# Patient Record
Sex: Female | Born: 2000 | Race: White | Hispanic: No | Marital: Single | Smoking: Never smoker
Health system: Southern US, Community
[De-identification: ages and names within clinical notes are randomized; demographics above are authoritative.]

## PROBLEM LIST (undated history)

## (undated) DIAGNOSIS — D649 Anemia, unspecified: Secondary | ICD-10-CM

## (undated) DIAGNOSIS — S92909A Unspecified fracture of unspecified foot, initial encounter for closed fracture: Secondary | ICD-10-CM

## (undated) HISTORY — DX: Unspecified fracture of unspecified foot, initial encounter for closed fracture: S92.909A

---
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
## 2018-01-05 DIAGNOSIS — L819 Disorder of pigmentation, unspecified: Secondary | ICD-10-CM | POA: Diagnosis not present

## 2018-01-05 DIAGNOSIS — J988 Other specified respiratory disorders: Secondary | ICD-10-CM | POA: Diagnosis not present

## 2018-01-07 DIAGNOSIS — L309 Dermatitis, unspecified: Secondary | ICD-10-CM | POA: Diagnosis not present

## 2018-01-07 DIAGNOSIS — D485 Neoplasm of uncertain behavior of skin: Secondary | ICD-10-CM | POA: Diagnosis not present

## 2018-01-07 DIAGNOSIS — L989 Disorder of the skin and subcutaneous tissue, unspecified: Secondary | ICD-10-CM | POA: Diagnosis not present

## 2018-01-23 DIAGNOSIS — L309 Dermatitis, unspecified: Secondary | ICD-10-CM | POA: Diagnosis not present

## 2018-03-31 ENCOUNTER — Ambulatory Visit (INDEPENDENT_AMBULATORY_CARE_PROVIDER_SITE_OTHER): Payer: 59 | Admitting: Women's Health

## 2018-03-31 ENCOUNTER — Encounter: Payer: Self-pay | Admitting: Women's Health

## 2018-03-31 VITALS — BP 126/78 | Ht 67.0 in | Wt 126.0 lb

## 2018-03-31 DIAGNOSIS — Z01419 Encounter for gynecological examination (general) (routine) without abnormal findings: Secondary | ICD-10-CM | POA: Diagnosis not present

## 2018-03-31 DIAGNOSIS — R634 Abnormal weight loss: Secondary | ICD-10-CM

## 2018-03-31 LAB — CBC WITH DIFFERENTIAL/PLATELET
Absolute Monocytes: 382 cells/uL (ref 200–950)
BASOS ABS: 50 {cells}/uL (ref 0–200)
BASOS PCT: 0.6 %
EOS PCT: 2.9 %
Eosinophils Absolute: 241 cells/uL (ref 15–500)
HEMATOCRIT: 40.7 % (ref 35.0–45.0)
HEMOGLOBIN: 13.7 g/dL (ref 11.7–15.5)
Lymphs Abs: 2150 cells/uL (ref 850–3900)
MCH: 26.3 pg — ABNORMAL LOW (ref 27.0–33.0)
MCHC: 33.7 g/dL (ref 32.0–36.0)
MCV: 78.3 fL — ABNORMAL LOW (ref 80.0–100.0)
MPV: 10 fL (ref 7.5–12.5)
Monocytes Relative: 4.6 %
NEUTROS ABS: 5478 {cells}/uL (ref 1500–7800)
Neutrophils Relative %: 66 %
Platelets: 226 10*3/uL (ref 140–400)
RBC: 5.2 10*6/uL — ABNORMAL HIGH (ref 3.80–5.10)
RDW: 12.9 % (ref 11.0–15.0)
Total Lymphocyte: 25.9 %
WBC: 8.3 10*3/uL (ref 3.8–10.8)

## 2018-03-31 LAB — TSH: TSH: 1.49 mIU/L

## 2018-03-31 MED ORDER — ETONOGESTREL-ETHINYL ESTRADIOL 0.12-0.015 MG/24HR VA RING: 1.0000 | VAGINAL_RING | VAGINAL | 4 refills | Status: DC

## 2018-03-31 NOTE — Progress Notes (Signed)
Amanda Ingram [birth year] [id number]    History: 17 y.o. SWF, presents for annual exam. Regular monthly cycles with Nuvaring. Guardasil vaccine series complete.Reports bruises on arms bilaterally and back of right leg without injury. Noticed bruises in April, has seen dermatologist.  Taking iron supplement daily, history of anemia.  Not sexually active at this time, denies need for STD screen, negative screen with past partner.  Always thin, good appetite.  Past medical history, past surgical history, family history and social history were all reviewed and documented in the EPIC chart. 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 Lives at home. 1 younger brother who is 34 years old. Parents healthy.  ROS:  A ROS was performed and pertinent positives and negatives are included.  Exam:  Vitals:   03/31/18 1008  BP: 126/78  Weight: 126 lb (57.2 kg)  Height: 5\' 7"  (1.702 m)   Body mass index is 19.73 kg/m.   General appearance:  Normal Thyroid:  Symmetrical, normal in size, without palpable masses or nodularity. Respiratory  Auscultation:  Clear without wheezing or rhonchi Cardiovascular  Auscultation:  Regular rate, without rubs, murmurs or gallops  Edema/varicosities:  Not grossly evident Abdominal  Soft,nontender, without masses, guarding or rebound.  Liver/spleen:  No organomegaly noted  Hernia:  None appreciated  Skin  Inspection:  Grossly normal   Breasts: Examined lying and sitting.     Right: Without masses, retractions, discharge or axillary adenopathy.     Left: Without masses, retractions, discharge or axillary adenopathy. Gentitourinary   Inguinal/mons:  Normal without inguinal adenopathy  External genitalia:  Normal  BUS/Urethra/Skene's glands:  Normal  Vagina:  Normal  Cervix:  Normal  Uterus:  normal in size, shape and  contour.  Midline and mobile  Adnexa/parametria:     Rt: Without masses or tenderness.   Lt: Without masses or tenderness.  Anus and perineum: Normal   Assessment/Plan:  17 y.o.  for annual exam with bruises on arms and back of right leg since April.  Monthly cycle on NuvaRing History of Anemia  Plan: Encouraged daily multivitamin with iron supplement. Encouraged high calcium , iron rich diet, , exercise and  SBE's.  Nuvaring prescription, discussed proper use, slight risk for blood clots and strokes. Discussed college safety activities and practices. Condoms encouraged when sexually active. CBC, TSH.    [place], 10:39 AM 03/31/2018

## 2018-04-01 DIAGNOSIS — L920 Granuloma annulare: Secondary | ICD-10-CM | POA: Diagnosis not present

## 2018-04-14 ENCOUNTER — Telehealth: Payer: Self-pay | Admitting: *Deleted

## 2018-04-14 DIAGNOSIS — Z862 Personal history of diseases of the blood and blood-forming organs and certain disorders involving the immune mechanism: Secondary | ICD-10-CM

## 2018-04-14 DIAGNOSIS — R718 Other abnormality of red blood cells: Secondary | ICD-10-CM

## 2018-04-14 NOTE — Telephone Encounter (Signed)
Patient mother Amy called stating patient is requesting referral to hematology c/o skin bruising, that was mentioned at [facility] on 03/31/18, has been there for several months. Please advise

## 2018-04-14 NOTE — Telephone Encounter (Signed)
Okay, Dr. Marin Olp would be good.  She had a normal H&H, MCV slightly decreased and RBC slightly increased.  Thanks for getting that scheduled.

## 2018-04-14 NOTE — Telephone Encounter (Signed)
Left message on mother cell for patient to check my chart for lab results and referral placed at hematology they will call to schedule.

## 2018-04-17 DIAGNOSIS — Z20828 Contact with and (suspected) exposure to other viral communicable diseases: Secondary | ICD-10-CM | POA: Diagnosis not present

## 2018-04-17 DIAGNOSIS — J029 Acute pharyngitis, unspecified: Secondary | ICD-10-CM | POA: Diagnosis not present

## 2018-04-21 ENCOUNTER — Encounter: Payer: Self-pay | Admitting: Hematology

## 2018-04-21 ENCOUNTER — Telehealth: Payer: Self-pay | Admitting: Hematology

## 2018-04-21 NOTE — Telephone Encounter (Signed)
A new hem appt has been scheduled for the pt to see Dr. Burr Medico on 1/15 at 2pm. Pt aware to arrive 30 minutes early. Letter mailed.

## 2018-04-21 NOTE — Telephone Encounter (Signed)
Appointment on 04/29/18 @ 2:00pm with Dr. Carmell Austria

## 2018-04-22 ENCOUNTER — Telehealth: Payer: Self-pay | Admitting: Hematology and Oncology

## 2018-04-22 ENCOUNTER — Telehealth: Payer: Self-pay | Admitting: Hematology

## 2018-04-22 NOTE — Telephone Encounter (Signed)
Received a vm from the pt to r/s appt to another provider. I was unable to leave the pt a vm bc it is full.

## 2018-04-22 NOTE — Telephone Encounter (Signed)
Pt has been rescheduled to see Dr. Lindi Adie on 1/27 at 1pm.

## 2018-04-23 ENCOUNTER — Telehealth: Payer: Self-pay | Admitting: *Deleted

## 2018-04-23 ENCOUNTER — Telehealth: Payer: Self-pay | Admitting: Hematology and Oncology

## 2018-04-23 DIAGNOSIS — J45909 Unspecified asthma, uncomplicated: Secondary | ICD-10-CM | POA: Diagnosis not present

## 2018-04-23 DIAGNOSIS — J988 Other specified respiratory disorders: Secondary | ICD-10-CM | POA: Diagnosis not present

## 2018-04-23 NOTE — Telephone Encounter (Signed)
(  late entry from 04/22/18) Patient called and left message about referral for hematology, I called patient and voicemail was full.

## 2018-04-23 NOTE — Telephone Encounter (Signed)
Pt cld to reschedule appt to 2/4 at 345pm.

## 2018-04-24 ENCOUNTER — Telehealth: Payer: Self-pay | Admitting: Hematology and Oncology

## 2018-04-24 NOTE — Telephone Encounter (Signed)
Pt has been rescheduled to see Dr. Lindi Adie on 1/18 at 830am. Aware to arrive 30 minutes early.

## 2018-04-29 ENCOUNTER — Encounter: Payer: 59 | Admitting: Hematology

## 2018-05-02 ENCOUNTER — Inpatient Hospital Stay: Payer: 59 | Attending: Hematology | Admitting: Hematology and Oncology

## 2018-05-02 ENCOUNTER — Telehealth: Payer: Self-pay | Admitting: Hematology and Oncology

## 2018-05-02 DIAGNOSIS — Z79899 Other long term (current) drug therapy: Secondary | ICD-10-CM | POA: Diagnosis not present

## 2018-05-02 DIAGNOSIS — R718 Other abnormality of red blood cells: Secondary | ICD-10-CM | POA: Diagnosis not present

## 2018-05-02 DIAGNOSIS — R233 Spontaneous ecchymoses: Secondary | ICD-10-CM | POA: Insufficient documentation

## 2018-05-02 DIAGNOSIS — E54 Ascorbic acid deficiency: Secondary | ICD-10-CM

## 2018-05-02 DIAGNOSIS — R238 Other skin changes: Secondary | ICD-10-CM | POA: Diagnosis not present

## 2018-05-02 NOTE — Telephone Encounter (Signed)
Gave calendar  °

## 2018-05-02 NOTE — Progress Notes (Signed)
[name] NOTE  Patient Care Team: Maurice Small, MD as PCP - General (Family Medicine)  CHIEF COMPLAINTS/PURPOSE OF CONSULTATION:  Excessive easy bruising  HISTORY OF PRESENTING ILLNESS:  Amanda Ingram 18 y.o. female is here because of easy bruising that has been going on since April 2019.  She has noticed these bruises on the arms which do not seem to go away quickly.  They in fact seem to expand slowly over time and new spots are also appearing.  She had seen dermatologist for this and she even had a biopsy which apparently showed a granuloma but I do not have the pathology report.  She saw another dermatologist who did not think she had any granulomatous disease.  She appears to be extremely frustrated and upset with these bruises.  She also had excessive and heavy menstrual cycles starting at age 21.  She was on birth control for a few years and it was discontinued because she had emotional disturbances from it.  She has now been on NuvaRing which appears to control her cycles.  She is also on oral iron supplement. When she had wisdom teeth extracted she had excessive bleeding.  She had an appendectomy but she did not know if they had any bleeding issues then.  I reviewed her records extensively and collaborated the history with the patient.  MEDICAL HISTORY:  Past Medical History:  Diagnosis Date  . Anemia   . Asthma   . Foot fracture    "no OR"    SURGICAL HISTORY: Past Surgical History:  Procedure Laterality Date  . APPENDECTOMY  02/04/2017  . LAPAROSCOPIC APPENDECTOMY N/A 02/04/2017   Procedure: APPENDECTOMY LAPAROSCOPIC;  Surgeon: Erroll Luna, MD;  Location: [facility];  Service: General;  Laterality: N/A;    SOCIAL HISTORY: Social History   Socioeconomic History  . Marital status: Single    Spouse name: Not on file  . Number of children: Not on file  . Years of education: Not on file  . Highest education level: Not on file  Occupational  History  . Not on file  Social Needs  . Financial resource strain: Not on file  . Food insecurity:    Worry: Not on file    Inability: Not on file  . Transportation needs:    Medical: Not on file    Non-medical: Not on file  Tobacco Use  . Smoking status: Never Smoker  . Smokeless tobacco: Never Used  Substance and Sexual Activity  . Alcohol use: Yes    Comment: 02/04/2017 "couple mixed drinks a couple times/year"  . Drug use: No  . Sexual activity: Yes    Birth control/protection: Condom    Comment: intercourse age 22, less than 36 sexual partners  Lifestyle  . Physical activity:    Days per week: Not on file    Minutes per session: Not on file  . Stress: Not on file  Relationships  . Social connections:    Talks on phone: Not on file    Gets together: Not on file    Attends religious service: Not on file    Active member of club or organization: Not on file    Attends meetings of clubs or organizations: Not on file    Relationship status: Not on file  . Intimate partner violence:    Fear of current or ex partner: Not on file    Emotionally abused: Not on file    Physically abused: Not on file  Forced sexual activity: Not on file  Other Topics Concern  . Not on file  Social History Narrative  . Not on file    FAMILY HISTORY: Family History  Problem Relation Age of Onset  . Hypercholesterolemia Maternal Grandmother   . Heart attack Maternal Grandmother   . Diabetes Maternal Grandmother   . Bipolar disorder Maternal Grandfather     ALLERGIES:  has No Known Allergies.  MEDICATIONS:  Current Outpatient Medications  Medication Sig Dispense Refill  . albuterol (PROVENTIL HFA;VENTOLIN HFA) 108 (90 BASE) MCG/ACT inhaler Inhale 2 puffs into the lungs every 6 (six) hours as needed. For shortness of breath    . etonogestrel-ethinyl estradiol (NUVARING) 0.12-0.015 MG/24HR vaginal ring Place 1 each vaginally every 28 (twenty-eight) days. Insert vaginally and leave in  place for 3 consecutive weeks, then remove for 1 week. 3 each 4  . IRON PO Take 1 tablet by mouth daily.     No current facility-administered medications for this visit.     REVIEW OF SYSTEMS:   Constitutional: Denies fevers, chills or abnormal night sweats Eyes: Denies blurriness of vision, double vision or watery eyes Ears, nose, mouth, throat, and face: Denies mucositis or sore throat Respiratory: Denies cough, dyspnea or wheezes Cardiovascular: Denies palpitation, chest discomfort or lower extremity swelling Gastrointestinal:  Denies nausea, heartburn or change in bowel habits Skin: Multiple bruises on her hands and legs Lymphatics: Denies new lymphadenopathy or easy bruising Neurological:Denies numbness, tingling or new weaknesses Behavioral/Psych: Tearful because of these bruises.   All other systems were reviewed with the patient and are negative.  PHYSICAL EXAMINATION: ECOG PERFORMANCE STATUS: 1 - Symptomatic but completely ambulatory  Vitals:   05/02/18 0812  BP: 123/76  Pulse: (!) 59  Resp: 18  Temp: 99 F (37.2 C)  SpO2: 92%   Filed Weights   05/02/18 0812  Weight: 125 lb 14.4 oz (57.1 kg)    GENERAL:alert, no distress and comfortable SKIN: Multiple bruises on the forearms as well as in the arms. EYES: normal, conjunctiva are pink and non-injected, sclera clear OROPHARYNX:no exudate, no erythema and lips, buccal mucosa, and tongue normal  NECK: supple, thyroid normal size, non-tender, without nodularity LYMPH:  no palpable lymphadenopathy in the cervical, axillary or inguinal LUNGS: clear to auscultation and percussion with normal breathing effort HEART: regular rate & rhythm and no murmurs and no lower extremity edema ABDOMEN:abdomen soft, non-tender and normal bowel sounds Musculoskeletal:no cyanosis of digits and no clubbing  PSYCH: alert & oriented x 3 with fluent speech NEURO: no focal motor/sensory deficits   LABORATORY DATA:  I have reviewed the  data as listed Lab Results  Component Value Date   WBC 8.3 03/31/2018   HGB 13.7 03/31/2018   HCT 40.7 03/31/2018   MCV 78.3 (L) 03/31/2018   PLT 226 03/31/2018   Lab Results  Component Value Date   NA 137 02/04/2017   K 3.6 02/04/2017   CL 106 02/04/2017   CO2 21 (L) 02/04/2017    RADIOGRAPHIC STUDIES: I have personally reviewed the radiological reports and agreed with the findings in the report.  ASSESSMENT AND PLAN:  Easy bruising I discussed with the patient the differential diagnosis of his improvement. 1.  Disorders of blood vessels and surrounding tissues including vitamin C deficiency and connective tissue disease 2.  Platelet abnormalities 3.  Coagulation disorders like liver disease vitamin K deficiency  Since the patient does not have thrombocytopenia with a platelet count on 03/31/2018 was 226, the easy bruising could  be related to medications or platelet function disorders. However patient does not take aspirin or any other antiplatelet products.  Work-up recommended: 1.  PT PTT INR, vitamin K 2.  Screening for von Willebrand disease  3. platelet function assay 4.  Consider electron microscopy of platelets to evaluate for delta granule storage pool disorders. We will order the electron microscopy once initial test of platelet function assay was available.  Patient has known iron deficiency with microcytosis, we will check iron studies. The WBC count and differential appear to be normal.  Based on that I reassured her that there is no concern for acute leukemia If none of the tests reveal any specific etiology, then we would have to discontinue her NuvaRing which she is extremely opposed.  Return to clinic in 2 weeks   All questions were answered. The patient knows to call the clinic with any problems, questions or concerns.    Harriette Ohara, MD 05/02/18

## 2018-05-02 NOTE — Assessment & Plan Note (Addendum)
I discussed with the patient the differential diagnosis of his improvement. 1.  Disorders of blood vessels and surrounding tissues including vitamin C deficiency and connective tissue disease 2.  Platelet abnormalities 3.  Coagulation disorders like liver disease vitamin K deficiency  Since the patient does not have thrombocytopenia with a platelet count on 03/31/2018 was 226, the easy bruising could be related to medications or platelet function disorders. However patient does not take aspirin or any other antiplatelet products.  Work-up recommended: 1.  PT PTT INR,  2.  Screening for von Willebrand disease  3. platelet function assay 4.  Consider electron microscopy of platelets to evaluate for delta granule storage pool disorders. We will order the electron microscopy once initial test of platelet function assay was available.  Return to clinic in 2 weeks

## 2018-05-05 ENCOUNTER — Inpatient Hospital Stay: Payer: 59

## 2018-05-05 DIAGNOSIS — R233 Spontaneous ecchymoses: Secondary | ICD-10-CM

## 2018-05-05 DIAGNOSIS — R238 Other skin changes: Secondary | ICD-10-CM | POA: Diagnosis not present

## 2018-05-05 LAB — CBC WITH DIFFERENTIAL (CANCER CENTER ONLY)
Abs Immature Granulocytes: 0.01 10*3/uL (ref 0.00–0.07)
BASOS PCT: 1 %
Basophils Absolute: 0 10*3/uL (ref 0.0–0.1)
Eosinophils Absolute: 0.2 10*3/uL (ref 0.0–0.5)
Eosinophils Relative: 3 %
HCT: 41.6 % (ref 36.0–46.0)
Hemoglobin: 13.5 g/dL (ref 12.0–15.0)
Immature Granulocytes: 0 %
Lymphocytes Relative: 35 %
Lymphs Abs: 2.3 10*3/uL (ref 0.7–4.0)
MCH: 25.9 pg — ABNORMAL LOW (ref 26.0–34.0)
MCHC: 32.5 g/dL (ref 30.0–36.0)
MCV: 79.8 fL — ABNORMAL LOW (ref 80.0–100.0)
Monocytes Absolute: 0.3 10*3/uL (ref 0.1–1.0)
Monocytes Relative: 5 %
NEUTROS ABS: 3.7 10*3/uL (ref 1.7–7.7)
Neutrophils Relative %: 56 %
Platelet Count: 256 10*3/uL (ref 150–400)
RBC: 5.21 MIL/uL — ABNORMAL HIGH (ref 3.87–5.11)
RDW: 13.3 % (ref 11.5–15.5)
WBC Count: 6.7 10*3/uL (ref 4.0–10.5)
nRBC: 0 % (ref 0.0–0.2)

## 2018-05-05 LAB — IRON AND TIBC
Iron: 77 ug/dL (ref 41–142)
Saturation Ratios: 19 % — ABNORMAL LOW (ref 21–57)
TIBC: 411 ug/dL (ref 236–444)
UIBC: 335 ug/dL (ref 120–384)

## 2018-05-05 LAB — CMP (CANCER CENTER ONLY)
ALT: 15 U/L (ref 0–44)
AST: 16 U/L (ref 15–41)
Albumin: 3.6 g/dL (ref 3.5–5.0)
Alkaline Phosphatase: 52 U/L (ref 38–126)
Anion gap: 9 (ref 5–15)
BUN: 8 mg/dL (ref 6–20)
CO2: 22 mmol/L (ref 22–32)
Calcium: 9 mg/dL (ref 8.9–10.3)
Chloride: 108 mmol/L (ref 98–111)
Creatinine: 0.79 mg/dL (ref 0.44–1.00)
GFR, Est AFR Am: >60 mL/min (ref >60)
GFR, Estimated: >60 mL/min (ref >60)
Glucose, Bld: 96 mg/dL (ref 70–99)
Potassium: 3.8 mmol/L (ref 3.5–5.1)
Sodium: 139 mmol/L (ref 135–145)
TOTAL PROTEIN: 7.1 g/dL (ref 6.5–8.1)
Total Bilirubin: 0.5 mg/dL (ref 0.3–1.2)

## 2018-05-05 LAB — FERRITIN: Ferritin: 11 ng/mL (ref 11–307)

## 2018-05-05 LAB — PROTIME-INR
INR: 0.98
Prothrombin Time: 12.9 seconds (ref 11.4–15.2)

## 2018-05-05 LAB — PLATELET FUNCTION ASSAY: Collagen / Epinephrine: 97 seconds (ref 0–193)

## 2018-05-05 LAB — APTT: aPTT: 31 seconds (ref 24–36)

## 2018-05-06 LAB — VON WILLEBRAND PANEL
Coagulation Factor VIII: 93 % (ref 56–140)
Ristocetin Co-factor, Plasma: 90 % (ref 50–200)
Von Willebrand Antigen, Plasma: 99 % (ref 50–200)

## 2018-05-06 LAB — COAG STUDIES INTERP REPORT

## 2018-05-06 LAB — ANTINUCLEAR ANTIBODIES, IFA: ANA Ab, IFA: NEGATIVE

## 2018-05-08 LAB — VITAMIN K1, SERUM: VITAMIN K1: 0.29 ng/mL (ref 0.13–1.88)

## 2018-05-11 ENCOUNTER — Encounter: Payer: 59 | Admitting: Hematology and Oncology

## 2018-05-12 NOTE — Progress Notes (Signed)
Patient Care Team: Maurice Small, MD as PCP - General (Family Medicine)  DIAGNOSIS:    ICD-10-CM   1. Easy bruising R23.8     CHIEF COMPLIANT: Follow-up of easy bruising and recent labs  INTERVAL HISTORY: Amanda Ingram is a 18 y.o. with above-mentioned history of easy bruising, heavy menstrual cycles, and iron deficiency. Her lab work from 05/05/18 showed normal platelet function, no indication of VWD, Hg 13.5, WBC 6.7, platelets 256, iron saturation at 19%, ferritin 11, and Vitamin K level 0.29. She presents to the clinic today with her dad. Her menstrual cycle ended 2 days prior to the blood work on 05/05/18. She takes oral iron supplements every other day. Her bruising has remained constant since her last visit, have been present for 6 months, and don't cause any pain. She is visibly upset about not having an answer for her bruising.   REVIEW OF SYSTEMS:   Constitutional: Denies fevers, chills or abnormal weight loss Eyes: Denies blurriness of vision Ears, nose, mouth, throat, and face: Denies mucositis or sore throat Respiratory: Denies cough, dyspnea or wheezes Cardiovascular: Denies palpitation, chest discomfort Gastrointestinal:  Denies nausea, heartburn or change in bowel habits Skin: (+) bruising on arms, legs, stomach Lymphatics: Denies new lymphadenopathy or easy bruising Neurological: Denies numbness, tingling or new weaknesses Behavioral/Psych: Mood is stable, no new changes  Extremities: No lower extremity edema All other systems were reviewed with the patient and are negative.  I have reviewed the past medical history, past surgical history, social history and family history with the patient and they are unchanged from previous note.  ALLERGIES:  has No Known Allergies.  MEDICATIONS:  Current Outpatient Medications  Medication Sig Dispense Refill  . albuterol (PROVENTIL HFA;VENTOLIN HFA) 108 (90 BASE) MCG/ACT inhaler Inhale 2 puffs into the lungs every 6 (six)  hours as needed. For shortness of breath    . etonogestrel-ethinyl estradiol (NUVARING) 0.12-0.015 MG/24HR vaginal ring Place 1 each vaginally every 28 (twenty-eight) days. Insert vaginally and leave in place for 3 consecutive weeks, then remove for 1 week. 3 each 4  . IRON PO Take 1 tablet by mouth daily.     No current facility-administered medications for this visit.     PHYSICAL EXAMINATION: ECOG PERFORMANCE STATUS: 1 - Symptomatic but completely ambulatory  Vitals:   05/13/18 1053  BP: (!) 123/95  Pulse: (!) 110  Resp: 18  Temp: 98.2 F (36.8 C)  SpO2: 99%   Filed Weights   05/13/18 1053  Weight: 128 lb (58.1 kg)    GENERAL: alert, no distress and comfortable SKIN: (+) bruising on stomach, arms, and legs EYES: normal, Conjunctiva are pink and non-injected, sclera clear OROPHARYNX: no exudate, no erythema and lips, buccal mucosa, and tongue normal  NECK: supple, thyroid normal size, non-tender, without nodularity LYMPH: no palpable lymphadenopathy in the cervical, axillary or inguinal LUNGS: clear to auscultation and percussion with normal breathing effort HEART: regular rate & rhythm and no murmurs and no lower extremity edema ABDOMEN: abdomen soft, non-tender and normal bowel sounds MUSCULOSKELETAL: no cyanosis of digits and no clubbing  NEURO: alert & oriented x 3 with fluent speech, no focal motor/sensory deficits EXTREMITIES: No lower extremity edema  LABORATORY DATA:  I have reviewed the data as listed CMP Latest Ref Rng & Units 05/05/2018 02/04/2017 02/04/2017  Glucose 70 - 99 mg/dL 96 - 83  BUN 6 - 20 mg/dL 8 - 6  Creatinine 0.44 - 1.00 mg/dL 0.79 0.71 0.65  Sodium 135 -  145 mmol/L 139 - 137  Potassium 3.5 - 5.1 mmol/L 3.8 - 3.6  Chloride 98 - 111 mmol/L 108 - 106  CO2 22 - 32 mmol/L 22 - 21(L)  Calcium 8.9 - 10.3 mg/dL 9.0 - 9.1  Total Protein 6.5 - 8.1 g/dL 7.1 - 6.8  Total Bilirubin 0.3 - 1.2 mg/dL 0.5 - 0.8  Alkaline Phos 38 - 126 U/L 52 - 53  AST 15  - 41 U/L 16 - 18  ALT 0 - 44 U/L 15 - 14    Lab Results  Component Value Date   WBC 6.7 05/05/2018   HGB 13.5 05/05/2018   HCT 41.6 05/05/2018   MCV 79.8 (L) 05/05/2018   PLT 256 05/05/2018   NEUTROABS 3.7 05/05/2018    ASSESSMENT & PLAN:  Easy bruising Work-up for easy bruising 1.  CBC and differential: Blood smear does not reveal any abnormalities in CBC other than slight microcytosis, hemoglobin 13.5, MCV 79.8, platelet count 256 2.  Platelet function assay normal 3.  PT PTT INR: Normal 4.  Von Willebrand factor analysis: Normal 5.  Iron studies: 19% iron saturation with a ferritin of 11 6.  ANA: Negative 7.  Vitamin K1: 0.29: Normal  Other than mild iron deficiency I did not find any other abnormality in the blood work. Patient is frustrated with lack of causation for her symptoms. I recommended that she start taking oral iron therapy. I also recommended referral to [facility] for second opinion.  No orders of the defined types were placed in this encounter.  The patient has a good understanding of the overall plan. she agrees with it. she will call with any problems that may develop before the next visit here.  Nicholas Lose, MD 05/13/2018  Julious Oka Dorshimer am acting as scribe for Dr. Nicholas Lose.  I have reviewed the above documentation for accuracy and completeness, and I agree with the above.

## 2018-05-13 ENCOUNTER — Inpatient Hospital Stay (HOSPITAL_BASED_OUTPATIENT_CLINIC_OR_DEPARTMENT_OTHER): Payer: 59 | Admitting: Hematology and Oncology

## 2018-05-13 ENCOUNTER — Other Ambulatory Visit: Payer: Self-pay

## 2018-05-13 ENCOUNTER — Telehealth: Payer: Self-pay

## 2018-05-13 DIAGNOSIS — R238 Other skin changes: Secondary | ICD-10-CM

## 2018-05-13 DIAGNOSIS — E611 Iron deficiency: Secondary | ICD-10-CM

## 2018-05-13 DIAGNOSIS — R233 Spontaneous ecchymoses: Secondary | ICD-10-CM

## 2018-05-13 NOTE — Progress Notes (Signed)
Per Dr.Gudena, sent referral to [facility] hematology clinic. Faxed referral and notes to [phone] Fax, [phone] Phone.

## 2018-05-13 NOTE — Telephone Encounter (Signed)
na

## 2018-05-13 NOTE — Assessment & Plan Note (Signed)
Work-up for easy bruising 1.  CBC and differential: Blood smear does not reveal any abnormalities in CBC other than slight microcytosis, hemoglobin 13.5, MCV 79.8, platelet count 256 2.  Platelet function assay normal 3.  PT PTT INR: Normal 4.  Von Willebrand factor analysis: Normal 5.  Iron studies: 19% iron saturation with a ferritin of 11 6.  ANA: Negative 7.  Vitamin K1: 0.29: Normal  Other than mild iron deficiency I did not find any other abnormality in the blood work. Patient is frustrated with lack of causation for her symptoms. I recommended that she start taking oral iron therapy. I also recommended referral to [facility] for second opinion.

## 2018-05-14 ENCOUNTER — Telehealth: Payer: Self-pay | Admitting: Hematology and Oncology

## 2018-05-14 NOTE — Telephone Encounter (Signed)
No los °

## 2018-05-18 DIAGNOSIS — J029 Acute pharyngitis, unspecified: Secondary | ICD-10-CM | POA: Diagnosis not present

## 2018-05-18 DIAGNOSIS — R591 Generalized enlarged lymph nodes: Secondary | ICD-10-CM | POA: Diagnosis not present

## 2018-05-19 ENCOUNTER — Encounter: Payer: 59 | Admitting: Hematology and Oncology

## 2018-06-29 DIAGNOSIS — M542 Cervicalgia: Secondary | ICD-10-CM | POA: Diagnosis not present

## 2018-07-30 DIAGNOSIS — B373 Candidiasis of vulva and vagina: Secondary | ICD-10-CM | POA: Diagnosis not present

## 2018-07-30 DIAGNOSIS — M7989 Other specified soft tissue disorders: Secondary | ICD-10-CM | POA: Diagnosis not present

## 2018-08-04 ENCOUNTER — Other Ambulatory Visit: Payer: Self-pay | Admitting: Family Medicine

## 2018-08-04 DIAGNOSIS — N63 Unspecified lump in unspecified breast: Secondary | ICD-10-CM

## 2018-08-11 ENCOUNTER — Ambulatory Visit
Admission: RE | Admit: 2018-08-11 | Discharge: 2018-08-11 | Disposition: A | Discharge status: Home / Self Care | Payer: 59 | Source: Ambulatory Visit | Attending: Family Medicine | Admitting: Family Medicine

## 2018-08-11 ENCOUNTER — Other Ambulatory Visit: Payer: Self-pay

## 2018-08-11 DIAGNOSIS — N6310 Unspecified lump in the right breast, unspecified quadrant: Secondary | ICD-10-CM | POA: Diagnosis not present

## 2018-08-11 DIAGNOSIS — N63 Unspecified lump in unspecified breast: Secondary | ICD-10-CM

## 2019-01-27 ENCOUNTER — Other Ambulatory Visit: Payer: Self-pay

## 2019-01-27 DIAGNOSIS — Z20822 Contact with and (suspected) exposure to covid-19: Secondary | ICD-10-CM

## 2019-01-28 LAB — NOVEL CORONAVIRUS, NAA: SARS-CoV-2, NAA: NOT DETECTED

## 2019-02-18 ENCOUNTER — Telehealth: Payer: Self-pay

## 2019-02-18 NOTE — Telephone Encounter (Signed)
Patient said she had two periods while her Nuvaring was in this month.  Stopped last Tuesday and now is bleeding again today. She questions does she need office visit? Or change the Nuvaring?

## 2019-02-19 ENCOUNTER — Other Ambulatory Visit: Payer: Self-pay

## 2019-02-19 NOTE — Telephone Encounter (Signed)
OV may be best, was having reg monthly cycles with nuva ring at annual.

## 2019-02-19 NOTE — Telephone Encounter (Signed)
Spoke with pt she will make appt to see [place]

## 2019-02-22 ENCOUNTER — Other Ambulatory Visit: Payer: Self-pay

## 2019-02-22 ENCOUNTER — Encounter: Payer: Self-pay | Admitting: Women's Health

## 2019-02-22 ENCOUNTER — Ambulatory Visit: Payer: 59 | Admitting: Women's Health

## 2019-02-22 VITALS — BP 118/72

## 2019-02-22 DIAGNOSIS — B373 Candidiasis of vulva and vagina: Secondary | ICD-10-CM

## 2019-02-22 DIAGNOSIS — N926 Irregular menstruation, unspecified: Secondary | ICD-10-CM

## 2019-02-22 DIAGNOSIS — B3731 Acute candidiasis of vulva and vagina: Secondary | ICD-10-CM

## 2019-02-22 LAB — PREGNANCY, URINE: Preg Test, Ur: NEGATIVE

## 2019-02-22 LAB — WET PREP FOR TRICH, YEAST, CLUE

## 2019-02-22 MED ORDER — FLUCONAZOLE 150 MG PO TABS: 150.0000 mg | ORAL_TABLET | Freq: Once | ORAL | 1 refills | Status: AC

## 2019-02-22 NOTE — Patient Instructions (Signed)
Vaginal Yeast infection, Adult  Vaginal yeast infection is a condition that causes vaginal discharge as well as soreness, swelling, and redness (inflammation) of the vagina. This is a common condition. Some women get this infection frequently. What are the causes? This condition is caused by a change in the normal balance of the yeast (candida) and bacteria that live in the vagina. This change causes an overgrowth of yeast, which causes the inflammation. What increases the risk? The condition is more likely to develop in women who:  Take antibiotic medicines.  Have diabetes.  Take birth control pills.  Are pregnant.  Douche often.  Have a weak body defense system (immune system).  Have been taking steroid medicines for a long time.  Frequently wear tight clothing. What are the signs or symptoms? Symptoms of this condition include:  White, thick, creamy vaginal discharge.  Swelling, itching, redness, and irritation of the vagina. The lips of the vagina (vulva) may be affected as well.  Pain or a burning feeling while urinating.  Pain during sex. How is this diagnosed? This condition is diagnosed based on:  Your medical history.  A physical exam.  A pelvic exam. Your health care provider will examine a sample of your vaginal discharge under a microscope. Your health care provider may send this sample for testing to confirm the diagnosis. How is this treated? This condition is treated with medicine. Medicines may be over-the-counter or prescription. You may be told to use one or more of the following:  Medicine that is taken by mouth (orally).  Medicine that is applied as a cream (topically).  Medicine that is inserted directly into the vagina (suppository). Follow these instructions at home:  Lifestyle  Do not have sex until your health care provider approves. Tell your sex partner that you have a yeast infection. That person should go to his or her health care  provider and ask if they should also be treated.  Do not wear tight clothes, such as pantyhose or tight pants.  Wear breathable cotton underwear. General instructions  Take or apply over-the-counter and prescription medicines only as told by your health care provider.  Eat more yogurt. This may help to keep your yeast infection from returning.  Do not use tampons until your health care provider approves.  Try taking a sitz bath to help with discomfort. This is a warm water bath that is taken while you are sitting down. The water should only come up to your hips and should cover your buttocks. Do this 3-4 times per day or as told by your health care provider.  Do not douche.  If you have diabetes, keep your blood sugar levels under control.  Keep all follow-up visits as told by your health care provider. This is important. Contact a health care provider if:  You have a fever.  Your symptoms go away and then return.  Your symptoms do not get better with treatment.  Your symptoms get worse.  You have new symptoms.  You develop blisters in or around your vagina.  You have blood coming from your vagina and it is not your menstrual period.  You develop pain in your abdomen. Summary  Vaginal yeast infection is a condition that causes discharge as well as soreness, swelling, and redness (inflammation) of the vagina.  This condition is treated with medicine. Medicines may be over-the-counter or prescription.  Take or apply over-the-counter and prescription medicines only as told by your health care provider.  Do not douche.   Do not have sex or use tampons until your health care provider approves.  Contact a health care provider if your symptoms do not get better with treatment or your symptoms go away and then return. This information is not intended to replace advice given to you by your health care provider. Make sure you discuss any questions you have with your health care  provider. Document Released: 01/09/2005 Document Revised: 08/18/2017 Document Reviewed: 08/18/2017 Elsevier Patient Education  2020 [employer]  

## 2019-02-22 NOTE — Progress Notes (Signed)
18 year old S WF presents with irregular menstrual cycle this month.  Has had regular monthly cycles with NuvaRing until October.  Was on levofloxacin, minocycline, rifampin October 18 and additional antibiotics on the 26 per dermatologist.  Scheduled to have 1 day of antibiotic use for the next 3 months.  Had bleeding October 30 for 3 days stop for 1 day and then 4 more days which was second week of NuvaRing, NuvaRing removed November 8 cycle has not yet started.  New partner in the last month.  Denies abdominal/back pain, urinary symptoms, vaginal discharge or fever.  Electronics engineer at [employer].  Exam: Appears well.  No CVAT.  Abdomen soft, nontender, external genitalia within normal limits, no erythema, speculum exam moderate amount of a white adherent discharge noted, vaginal wall slightly erythematous, no menses visible, wet prep positive for yeast, GC/Chlamydia culture pending.  Bimanual no CMT or adnexal tenderness. UPT negative  DU B x1 month Yeast vaginitis STD screen  Plan: We will check HIV, RPR at annual exam in December.  Strongly encouraged condoms until permanent partner and especially while on antibiotics.  Continue NuvaRing.  Diflucan 150 p.o. x1 dose with refill.  Yeast prevention discussed but reviewed most likely from antibiotic use and possibly caused irregular cycle also.  Instructed to call if continued irregular bleeding.

## 2019-02-23 LAB — C. TRACHOMATIS/N. GONORRHOEAE RNA
C. trachomatis RNA, TMA: NOT DETECTED
N. gonorrhoeae RNA, TMA: NOT DETECTED

## 2019-04-05 ENCOUNTER — Ambulatory Visit (INDEPENDENT_AMBULATORY_CARE_PROVIDER_SITE_OTHER): Payer: 59 | Admitting: Women's Health

## 2019-04-05 ENCOUNTER — Other Ambulatory Visit: Payer: Self-pay

## 2019-04-05 ENCOUNTER — Encounter: Payer: Self-pay | Admitting: Women's Health

## 2019-04-05 VITALS — BP 118/74 | Ht 66.0 in | Wt 126.0 lb

## 2019-04-05 DIAGNOSIS — Z113 Encounter for screening for infections with a predominantly sexual mode of transmission: Secondary | ICD-10-CM

## 2019-04-05 DIAGNOSIS — Z01419 Encounter for gynecological examination (general) (routine) without abnormal findings: Secondary | ICD-10-CM

## 2019-04-05 NOTE — Patient Instructions (Addendum)
Good to see you, go [facility]!!!  Health Maintenance, Female Adopting a healthy lifestyle and getting preventive care are important in promoting health and wellness. Ask your health care provider about:  The right schedule for you to have regular tests and exams.  Things you can do on your own to prevent diseases and keep yourself healthy. What should I know about diet, weight, and exercise? Eat a healthy diet   Eat a diet that includes plenty of vegetables, fruits, low-fat dairy products, and lean protein.  Do not eat a lot of foods that are high in solid fats, added sugars, or sodium. Maintain a healthy weight Body mass index (BMI) is used to identify weight problems. It estimates body fat based on height and weight. Your health care provider can help determine your BMI and help you achieve or maintain a healthy weight. Get regular exercise Get regular exercise. This is one of the most important things you can do for your health. Most adults should:  Exercise for at least 150 minutes each week. The exercise should increase your heart rate and make you sweat (moderate-intensity exercise).  Do strengthening exercises at least twice a week. This is in addition to the moderate-intensity exercise.  Spend less time sitting. Even light physical activity can be beneficial. Watch cholesterol and blood lipids Have your blood tested for lipids and cholesterol at 20 years of age, then have this test every 5 years. Have your cholesterol levels checked more often if:  Your lipid or cholesterol levels are high.  You are older than 20 years of age.  You are at high risk for heart disease. What should I know about cancer screening? Depending on your health history and family history, you may need to have cancer screening at various ages. This may include screening for:  Breast cancer.  Cervical cancer.  Colorectal cancer.  Skin cancer.  Lung cancer. What should I know about heart  disease, diabetes, and high blood pressure? Blood pressure and heart disease  High blood pressure causes heart disease and increases the risk of stroke. This is more likely to develop in people who have high blood pressure readings, are of African descent, or are overweight.  Have your blood pressure checked: ? Every 3-5 years if you are 28-30 years of age. ? Every year if you are 27 years old or older. Diabetes Have regular diabetes screenings. This checks your fasting blood sugar level. Have the screening done:  Once every three years after age 20 if you are at a normal weight and have a low risk for diabetes.  More often and at a younger age if you are overweight or have a high risk for diabetes. What should I know about preventing infection? Hepatitis B If you have a higher risk for hepatitis B, you should be screened for this virus. Talk with your health care provider to find out if you are at risk for hepatitis B infection. Hepatitis C Testing is recommended for:  Everyone born from [birth year] through 1967.  Anyone with known risk factors for hepatitis C. Sexually transmitted infections (STIs)  Get screened for STIs, including gonorrhea and chlamydia, if: ? You are sexually active and are younger than 20 years of age. ? You are older than 20 years of age and your health care provider tells you that you are at risk for this type of infection. ? Your sexual activity has changed since you were last screened, and you are at increased risk for chlamydia  or gonorrhea. Ask your health care provider if you are at risk.  Ask your health care provider about whether you are at high risk for HIV. Your health care provider may recommend a prescription medicine to help prevent HIV infection. If you choose to take medicine to prevent HIV, you should first get tested for HIV. You should then be tested every 3 months for as long as you are taking the medicine. Pregnancy  If you are about to stop  having your period (premenopausal) and you may become pregnant, seek counseling before you get pregnant.  Take 400 to 800 micrograms (mcg) of folic acid every day if you become pregnant.  Ask for birth control (contraception) if you want to prevent pregnancy. Osteoporosis and menopause Osteoporosis is a disease in which the bones lose minerals and strength with aging. This can result in bone fractures. If you are 66 years old or older, or if you are at risk for osteoporosis and fractures, ask your health care provider if you should:  Be screened for bone loss.  Take a calcium or vitamin D supplement to lower your risk of fractures.  Be given hormone replacement therapy (HRT) to treat symptoms of menopause. Follow these instructions at home: Lifestyle  Do not use any products that contain nicotine or tobacco, such as cigarettes, e-cigarettes, and chewing tobacco. If you need help quitting, ask your health care provider.  Do not use street drugs.  Do not share needles.  Ask your health care provider for help if you need support or information about quitting drugs. Alcohol use  Do not drink alcohol if: ? Your health care provider tells you not to drink. ? You are pregnant, may be pregnant, or are planning to become pregnant.  If you drink alcohol: ? Limit how much you use to 0-1 drink a day. ? Limit intake if you are breastfeeding.  Be aware of how much alcohol is in your drink. In the [place] one drink equals one 12 oz bottle of beer (355 mL), one 5 oz glass of wine (148 mL), or one 1 oz glass of hard liquor (44 mL). General instructions  Schedule regular health, dental, and eye exams.  Stay current with your vaccines.  Tell your health care provider if: ? You often feel depressed. ? You have ever been abused or do not feel safe at home. Summary  Adopting a healthy lifestyle and getting preventive care are important in promoting health and wellness.  Follow your health  care provider's instructions about healthy diet, exercising, and getting tested or screened for diseases.  Follow your health care provider's instructions on monitoring your cholesterol and blood pressure. This information is not intended to replace advice given to you by your health care provider. Make sure you discuss any questions you have with your health care provider. Document Released: 10/15/2010 Document Revised: 03/25/2018 Document Reviewed: 03/25/2018 Elsevier Patient Education  2020 [employer].

## 2019-04-05 NOTE — Progress Notes (Signed)
Amanda Ingram 2000/04/18 [id number]    History:    Presents for annual exam.  Monthly cycle on NuvaRing without complaint.  New partner.  Gardasil series completed.  Recently treated for UTI.  Past medical history, past surgical history, family history and social history were all reviewed and documented in the EPIC chart.  2018 appendectomy.  [employer],  dental school or orthodontics is goal.  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  History of easy bruising, tanning bed monthly per dermatologist.  ROS:  A ROS was performed and pertinent positives and negatives are included.  Exam:  Vitals:   04/05/19 1428  BP: 118/74  Weight: 126 lb (57.2 kg)  Height: 5\' 6"  (1.676 m)   Body mass index is 20.34 kg/m.   General appearance:  Normal Thyroid:  Symmetrical, normal in size, without palpable masses or nodularity. Respiratory  Auscultation:  Clear without wheezing or rhonchi Cardiovascular  Auscultation:  Regular rate, without rubs, murmurs or gallops  Edema/varicosities:  Not grossly evident Abdominal  Soft,nontender, without masses, guarding or rebound.  Liver/spleen:  No organomegaly noted  Hernia:  None appreciated  Skin  Inspection:  Grossly normal   Breasts: Examined lying and sitting.     Right: Without masses, retractions, discharge or axillary adenopathy.     Left: Without masses, retractions, discharge or axillary adenopathy. Gentitourinary   Inguinal/mons:  Normal without inguinal adenopathy  External genitalia:  Normal  BUS/Urethra/Skene's glands:  Normal  Vagina:  Normal  Cervix:  Normal  Uterus:  normal in size, shape and contour.  Midline and mobile  Adnexa/parametria:     Rt: Without masses or tenderness.   Lt: Without masses or tenderness.  Anus and perineum: Normal    Assessment/Plan:  18 y.o. S WF G0 for annual exam with no complaints.  Monthly cycle on NuvaRing STD screen  Plan: NuvaRing prescription, proper  use, slight risk for blood clots and strokes reviewed.  SBEs, exercise, calcium rich foods, MVI daily encouraged.  Campus and dating safety discussed.  CBC, GC/chlamydia, HIV, RPR.  Pap screening guidelines reviewed.Amanda Ingram [facility], 5:01 PM 04/05/2019

## 2019-04-06 LAB — CBC WITH DIFFERENTIAL/PLATELET
Absolute Monocytes: 482 cells/uL (ref 200–950)
Basophils Absolute: 40 cells/uL (ref 0–200)
Basophils Relative: 0.6 %
Eosinophils Absolute: 188 cells/uL (ref 15–500)
Eosinophils Relative: 2.8 %
HCT: 41 % (ref 35.0–45.0)
Hemoglobin: 13.6 g/dL (ref 11.7–15.5)
Lymphs Abs: 2050 cells/uL (ref 850–3900)
MCH: 25.6 pg — ABNORMAL LOW (ref 27.0–33.0)
MCHC: 33.2 g/dL (ref 32.0–36.0)
MCV: 77.2 fL — ABNORMAL LOW (ref 80.0–100.0)
MPV: 10.5 fL (ref 7.5–12.5)
Monocytes Relative: 7.2 %
Neutro Abs: 3940 cells/uL (ref 1500–7800)
Neutrophils Relative %: 58.8 %
Platelets: 252 10*3/uL (ref 140–400)
RBC: 5.31 10*6/uL — ABNORMAL HIGH (ref 3.80–5.10)
RDW: 12.6 % (ref 11.0–15.0)
Total Lymphocyte: 30.6 %
WBC: 6.7 10*3/uL (ref 3.8–10.8)

## 2019-04-06 LAB — HIV ANTIBODY (ROUTINE TESTING W REFLEX): HIV 1&2 Ab, 4th Generation: NONREACTIVE

## 2019-04-06 LAB — CHLAMYDIA PROBE AMP THINPREP: C. trachomatis RNA, TMA: NOT DETECTED

## 2019-04-06 LAB — GC PROBE AMP THINPREP: N. gonorrhoeae RNA, TMA: NOT DETECTED

## 2019-04-06 LAB — RPR: RPR Ser Ql: NONREACTIVE

## 2019-04-12 IMAGING — CT CT ABD-PELV W/ CM
2 of 4 series · 16 of 46 positions shown, 18 images · IV contrast (Omni 300)
Comparison: None.

CLINICAL DATA: Lower abdominal pain.

EXAM:
CT ABDOMEN AND PELVIS WITH CONTRAST
TECHNIQUE: Multidetector CT imaging of the abdomen and pelvis was performed
using the standard protocol following bolus administration of
intravenous contrast.
CONTRAST:  100mL IZOJVO-QQQ IOPAMIDOL (IZOJVO-QQQ) INJECTION 61%

[Series 3: a/p w/ 5mm · axial · 0.77mm/px · z∈[+733,+1158]mm · 13 of 95 slices shown, 15 images]
[im 5/95  soft-tissue]
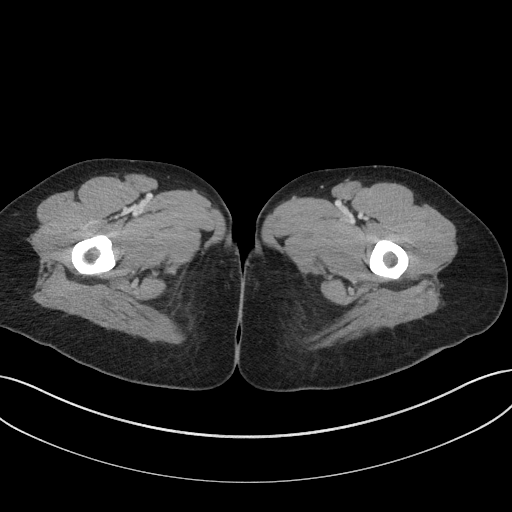
[im 5/95  bone]
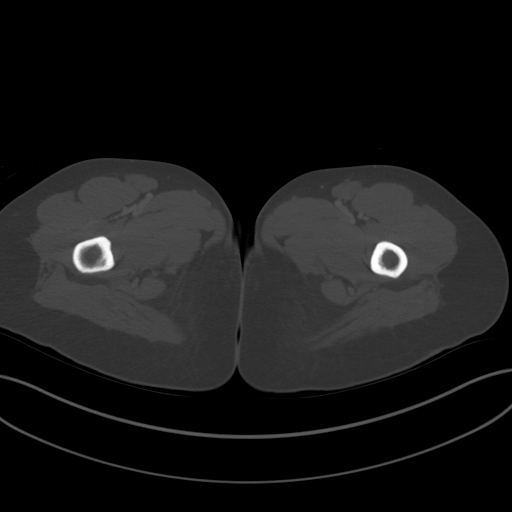
[im 13/95  soft-tissue]
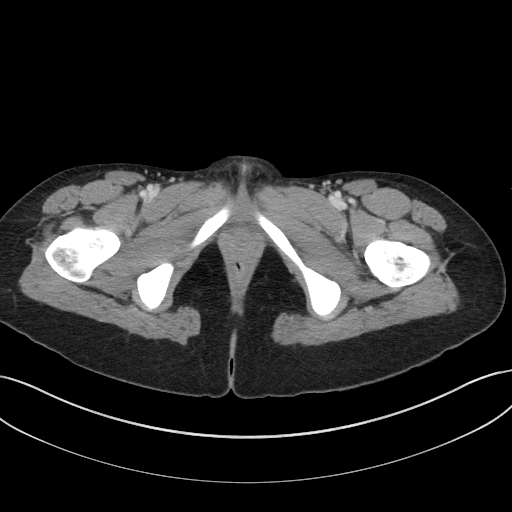
[im 21/95  soft-tissue]
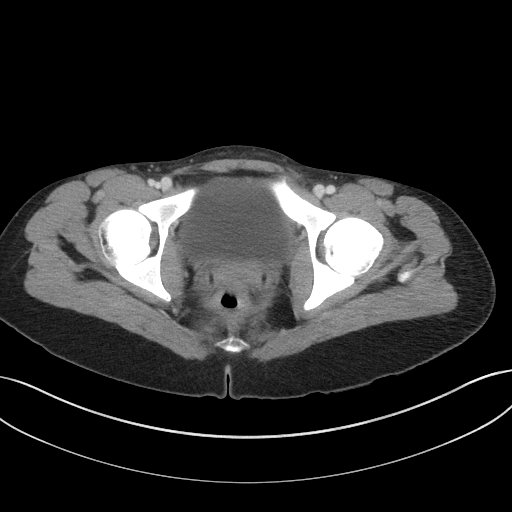
[im 25/95  soft-tissue]
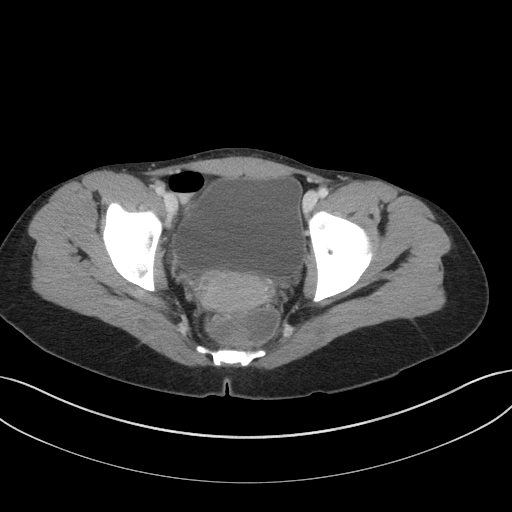
[im 33/95  soft-tissue]
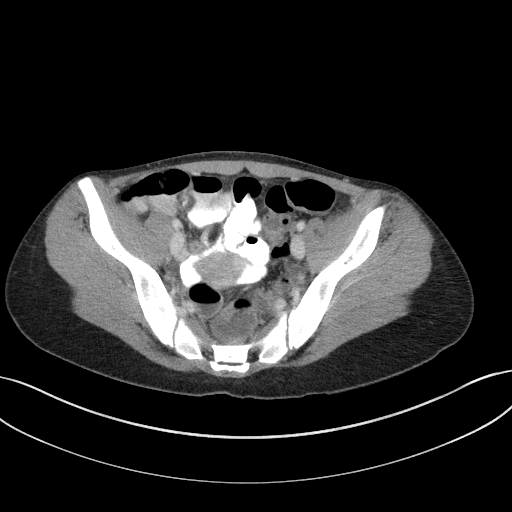
[im 41/95  soft-tissue]
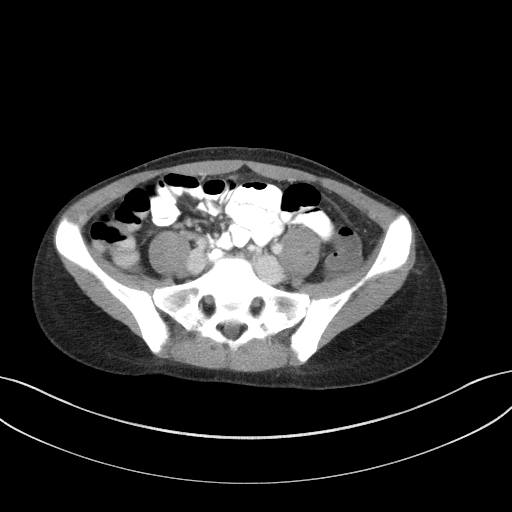
[im 50/95  soft-tissue]
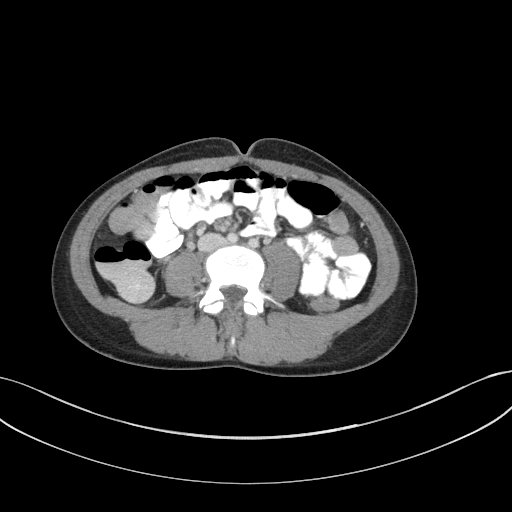
[im 54/95  soft-tissue]
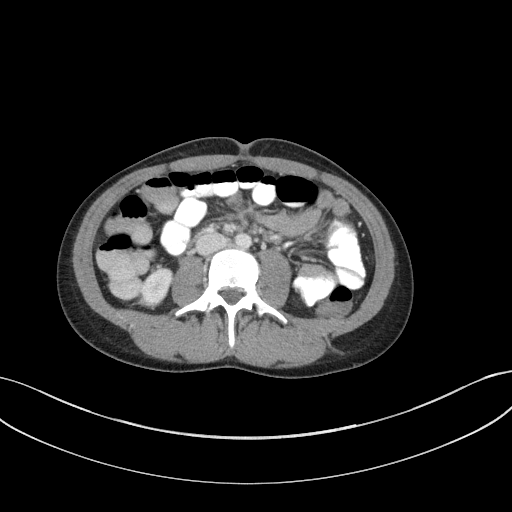
[im 62/95  soft-tissue]
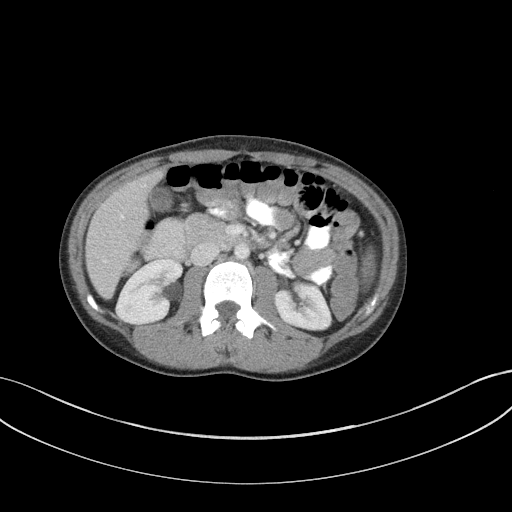
[im 62/95  bone]
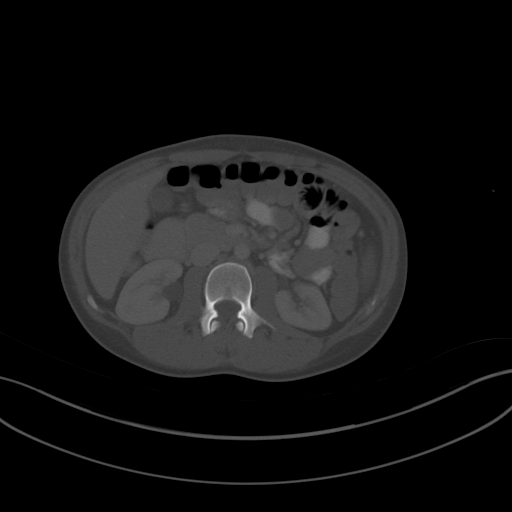
[im 70/95  soft-tissue]
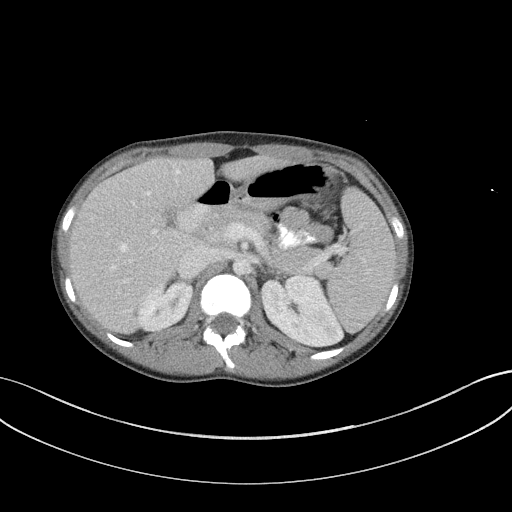
[im 74/95  soft-tissue]
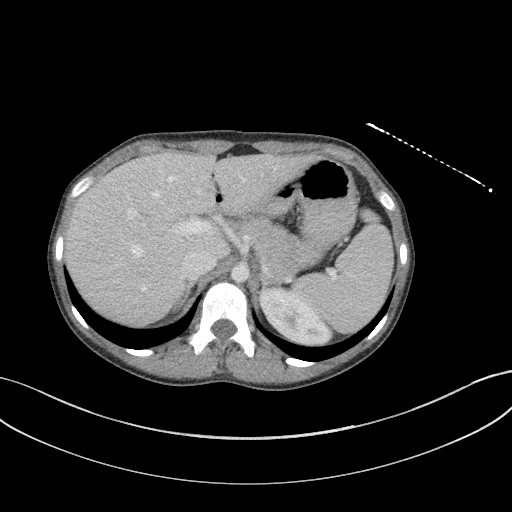
[im 82/95  soft-tissue]
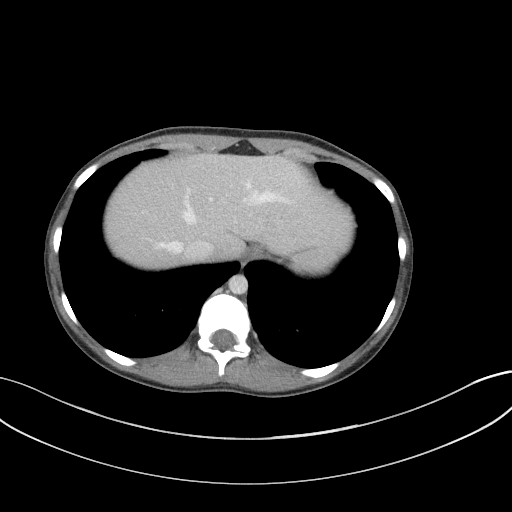
[im 90/95  soft-tissue]
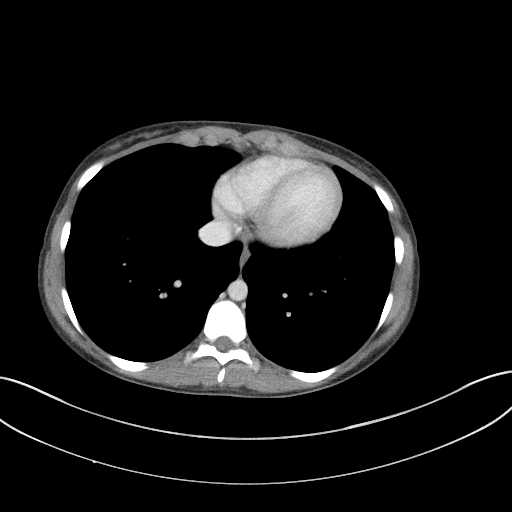

[Series 6: a/p w/ cor · coronal · 0.71mm/px · 3 of 118 slices shown]
[im 40/118  soft-tissue]
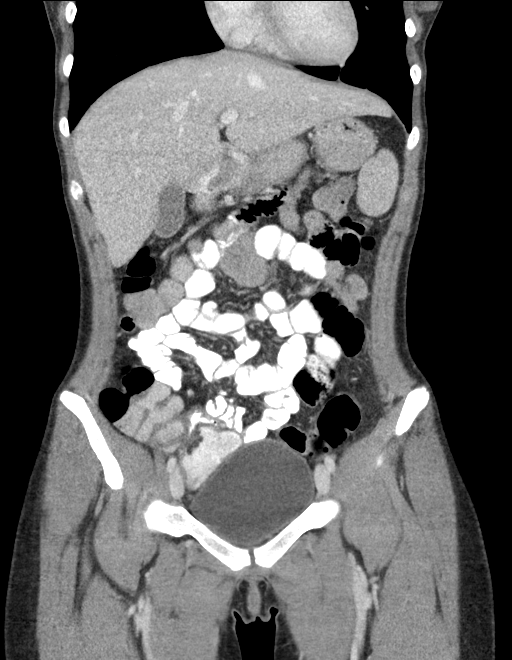
[im 53/118  soft-tissue]
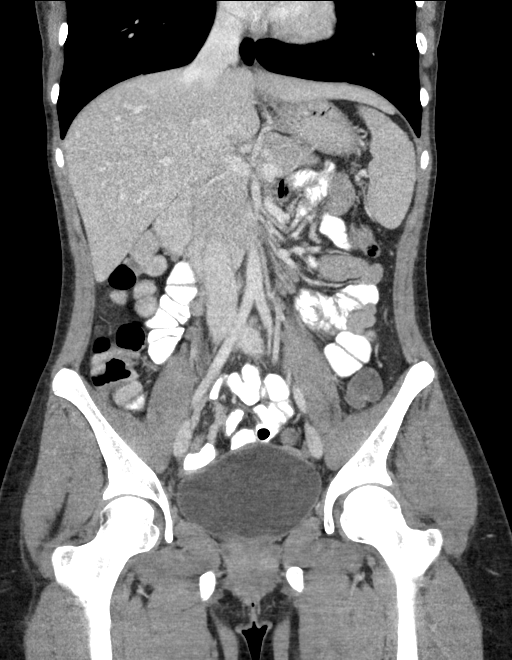
[im 66/118  soft-tissue]
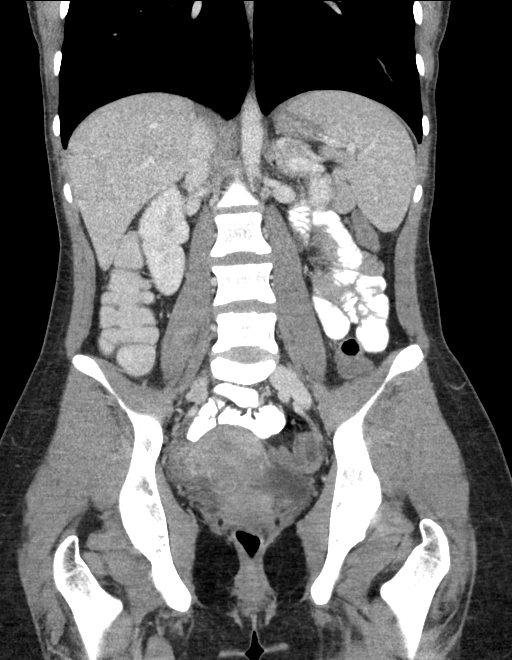

[16 of 46 positions shown; findings below may reference images not displayed]

FINDINGS: Lower chest: Lung bases are clear. No effusions. Heart is normal
size.

Hepatobiliary: No focal hepatic abnormality. Gallbladder
unremarkable.

Pancreas: No focal abnormality or ductal dilatation.

Spleen: No focal abnormality.  Normal size.

Adrenals/Urinary Tract: No adrenal abnormality. No focal renal
abnormality. No stones or hydronephrosis. Urinary bladder is
unremarkable.

Stomach/Bowel: Proximal appendix appears normal. High-density
material noted within the proximal to mid appendix which could be
appendicolith or contrast. The mid to distal appendix is mildly
dilated, measuring up to 9 mm. No significant surrounding
inflammation or fluid. Stomach, large and small bowel grossly
unremarkable.

Vascular/Lymphatic: No evidence of aneurysm or adenopathy.

Reproductive: Uterus and adnexa unremarkable.  No mass.

Other: No free fluid or free air.

Musculoskeletal: No acute bony abnormality.
IMPRESSION: High-density material within the proximal to mid appendix which
could be contrast or appendicoliths. The mid to distal appendix is
slightly dilated at 9 mm. While there is no surrounding inflammatory
change, cannot exclude early appendicitis. Recommend clinical
correlation.

## 2019-04-24 ENCOUNTER — Other Ambulatory Visit: Payer: Self-pay | Admitting: Women's Health

## 2019-07-05 ENCOUNTER — Other Ambulatory Visit: Payer: Self-pay

## 2019-07-06 ENCOUNTER — Encounter: Payer: Self-pay | Admitting: Women's Health

## 2019-07-06 ENCOUNTER — Ambulatory Visit: Payer: 59 | Admitting: Women's Health

## 2019-07-06 VITALS — BP 122/80

## 2019-07-06 DIAGNOSIS — N644 Mastodynia: Secondary | ICD-10-CM | POA: Diagnosis not present

## 2019-07-06 NOTE — Progress Notes (Signed)
19 year old S WF G0 presents with complaint of left breast tenderness/pain intermittent in nature for the past month.  Denies injury, change in exercise routine.  Drinks 1 caffeinated beverage daily.  No family history of breast cancer.  Denies vaginal discharge, urinary symptoms, abdominal pain or fever.  Monthly cycle on NuvaRing.  No medical problems.  Not sexually active denies need for STD screen.  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  Exam: Appears well.  Breast examined sitting and lying position, no erythema, retractions, dimpling, nipple discharge, palpable nodules or masses.  Slight tenderness on the left outer aspect of breast.  Left mastodynia  Plan: Vitamin E twice daily, Motrin as needed for the discomfort.  If pain/discomfort persists will refer for left breast ultrasound.  Reassurance given.

## 2019-07-06 NOTE — Patient Instructions (Signed)
Vit E twice daily if continues call and will get [place] of breast Breast Tenderness Breast tenderness is a common problem for women of all ages, but may also occur in men. Breast tenderness may range from mild discomfort to severe pain. In women, the pain usually comes and goes with the menstrual cycle, but it can also be constant. Breast tenderness has many possible causes, including hormone changes, infections, and taking certain medicines. You may have tests, such as a mammogram or an ultrasound, to check for any unusual findings. Having breast tenderness usually does not mean that you have breast cancer. Follow these instructions at home: Managing pain and discomfort   If directed, put ice to the painful area. To do this: ? Put ice in a plastic bag. ? Place a towel between your skin and the bag. ? Leave the ice on for 20 minutes, 2-3 times a day.  Wear a supportive bra, especially during exercise. You may also want to wear a supportive bra while sleeping if your breasts are very tender. Medicines  Take over-the-counter and prescription medicines only as told by your health care provider. If the cause of your pain is infection, you may be prescribed an antibiotic medicine.  If you were prescribed an antibiotic, take it as told by your health care provider. Do not stop taking the antibiotic even if you start to feel better. Eating and drinking  Your health care provider may recommend that you lessen the amount of fat in your diet. You can do this by: ? Limiting fried foods. ? Cooking foods using methods such as baking, boiling, grilling, and broiling.  Decrease the amount of caffeine in your diet. Instead, drink more water and choose caffeine-free drinks. General instructions   Keep a log of the days and times when your breasts are most tender.  Ask your health care provider how to do breast exams at home. This will help you notice if you have an unusual growth or lump.  Keep all  follow-up visits as told by your health care provider. This is important. Contact a health care provider if:  Any part of your breast is hard, red, and hot to the touch. This may be a sign of infection.  You are a woman and: ? Not breastfeeding and you have fluid, especially blood or pus, coming out of your nipples. ? Have a new or painful lump in your breast that remains after your menstrual period ends.  You have a fever.  Your pain does not improve or it gets worse.  Your pain is interfering with your daily activities. Summary  Breast tenderness may range from mild discomfort to severe pain.  Breast tenderness has many possible causes, including hormone changes, infections, and taking certain medicines.  It can be treated with ice, wearing a supportive bra, and medicines.  Make changes to your diet if told to by your health care provider. This information is not intended to replace advice given to you by your health care provider. Make sure you discuss any questions you have with your health care provider. Document Revised: 08/24/2018 Document Reviewed: 08/24/2018 Elsevier Patient Education  [employer].

## 2020-02-13 ENCOUNTER — Encounter (HOSPITAL_COMMUNITY): Payer: Self-pay | Admitting: *Deleted

## 2020-02-13 ENCOUNTER — Other Ambulatory Visit: Payer: Self-pay

## 2020-02-13 ENCOUNTER — Emergency Department (HOSPITAL_COMMUNITY)
Admission: EM | Admit: 2020-02-13 | Discharge: 2020-02-14 | Disposition: A | Discharge status: Home / Self Care | Payer: 59 | Attending: Emergency Medicine | Admitting: Emergency Medicine

## 2020-02-13 DIAGNOSIS — R634 Abnormal weight loss: Secondary | ICD-10-CM | POA: Diagnosis not present

## 2020-02-13 DIAGNOSIS — R110 Nausea: Secondary | ICD-10-CM | POA: Diagnosis present

## 2020-02-13 DIAGNOSIS — J45909 Unspecified asthma, uncomplicated: Secondary | ICD-10-CM | POA: Insufficient documentation

## 2020-02-13 LAB — URINALYSIS, ROUTINE W REFLEX MICROSCOPIC
Bilirubin Urine: NEGATIVE
Glucose, UA: NEGATIVE mg/dL
Hgb urine dipstick: NEGATIVE
Ketones, ur: NEGATIVE mg/dL
Nitrite: NEGATIVE
Protein, ur: NEGATIVE mg/dL
Specific Gravity, Urine: 1.013 (ref 1.005–1.030)
pH: 7 (ref 5.0–8.0)

## 2020-02-13 LAB — COMPREHENSIVE METABOLIC PANEL
ALT: 14 U/L (ref 0–44)
AST: 18 U/L (ref 15–41)
Albumin: 3.8 g/dL (ref 3.5–5.0)
Alkaline Phosphatase: 44 U/L (ref 38–126)
Anion gap: 12 (ref 5–15)
BUN: 5 mg/dL — ABNORMAL LOW (ref 6–20)
CO2: 21 mmol/L — ABNORMAL LOW (ref 22–32)
Calcium: 9 mg/dL (ref 8.9–10.3)
Chloride: 107 mmol/L (ref 98–111)
Creatinine, Ser: 0.7 mg/dL (ref 0.44–1.00)
GFR, Estimated: >60 mL/min (ref >60)
Glucose, Bld: 87 mg/dL (ref 70–99)
Potassium: 3.7 mmol/L (ref 3.5–5.1)
Sodium: 140 mmol/L (ref 135–145)
Total Bilirubin: 0.3 mg/dL (ref 0.3–1.2)
Total Protein: 6.7 g/dL (ref 6.5–8.1)

## 2020-02-13 LAB — LIPASE, BLOOD: Lipase: 44 U/L (ref 11–51)

## 2020-02-13 LAB — CBC
HCT: 42.7 % (ref 36.0–46.0)
Hemoglobin: 13.9 g/dL (ref 12.0–15.0)
MCH: 25.9 pg — ABNORMAL LOW (ref 26.0–34.0)
MCHC: 32.6 g/dL (ref 30.0–36.0)
MCV: 79.5 fL — ABNORMAL LOW (ref 80.0–100.0)
Platelets: 258 10*3/uL (ref 150–400)
RBC: 5.37 MIL/uL — ABNORMAL HIGH (ref 3.87–5.11)
RDW: 14.1 % (ref 11.5–15.5)
WBC: 9.7 10*3/uL (ref 4.0–10.5)
nRBC: 0 % (ref 0.0–0.2)

## 2020-02-13 LAB — I-STAT BETA HCG BLOOD, ED (MC, WL, AP ONLY): I-stat hCG, quantitative: <5 m[IU]/mL (ref <5)

## 2020-02-13 NOTE — ED Triage Notes (Signed)
The pt has been having nausea for 3 weeks she has been seen at urgent cares  Neg  For covid neg for flu  She has had a 20 lb weight loss  lmp last week

## 2020-02-14 LAB — TSH: TSH: 3.248 u[IU]/mL (ref 0.350–4.500)

## 2020-02-14 LAB — T4, FREE: Free T4: 0.83 ng/dL (ref 0.61–1.12)

## 2020-02-14 LAB — MONONUCLEOSIS SCREEN: Mono Screen: NEGATIVE

## 2020-02-14 MED ORDER — PANTOPRAZOLE SODIUM 20 MG PO TBEC: 20.0000 mg | DELAYED_RELEASE_TABLET | Freq: Every day | ORAL | 0 refills | Status: DC

## 2020-02-14 MED ORDER — ONDANSETRON 4 MG PO TBDP: 4.0000 mg | ORAL_TABLET | Freq: Three times a day (TID) | ORAL | 0 refills | Status: DC | PRN

## 2020-02-14 NOTE — ED Provider Notes (Signed)
[facility] EMERGENCY DEPARTMENT Provider Note   CSN: [id number] Arrival date & time: 02/13/20  2146     History Chief Complaint  Patient presents with  . Nausea    Amanda Ingram is a 19 y.o. female.  3 weeks of waxing and waning nausea.  Meals seem to make it worse.  Weight loss.  No vomiting no diarrhea.  Normal urination able to hydrate well.  Family history of thyroid disease but no inflammatory bowel disease.  No bloody stools.  No fevers chills.  No sick contacts denies any illicit substance use denies any alcohol use denies any cigarette smoking.  History of appendectomy otherwise no surgical history        Past Medical History:  Diagnosis Date  . Anemia   . Asthma   . Foot fracture    "no OR"    Patient Active Problem List   Diagnosis Date Noted  . Easy bruising 05/02/2018  . Appendicitis 02/04/2017    Past Surgical History:  Procedure Laterality Date  . APPENDECTOMY  02/04/2017  . LAPAROSCOPIC APPENDECTOMY N/A 02/04/2017   Procedure: APPENDECTOMY LAPAROSCOPIC;  Surgeon: Erroll Luna, MD;  Location: [facility] OR;  Service: General;  Laterality: N/A;     OB History    Gravida  0   Para  0   Term  0   Preterm  0   AB  0   Living  0     SAB  0   TAB  0   Ectopic  0   Multiple  0   Live Births  0           Family History  Problem Relation Age of Onset  . Hypercholesterolemia Maternal Grandmother   . Heart attack Maternal Grandmother   . Diabetes Maternal Grandmother   . Bipolar disorder Maternal Grandfather     Social History   Tobacco Use  . Smoking status: Never Smoker  . Smokeless tobacco: Never Used  Vaping Use  . Vaping Use: Never used  Substance Use Topics  . Alcohol use: Yes    Comment: 02/04/2017 "couple mixed drinks a couple times/year"  . Drug use: No    Home Medications Prior to Admission medications   Medication Sig Start Date End Date Taking? Authorizing Provider  albuterol (PROVENTIL  HFA;VENTOLIN HFA) 108 (90 BASE) MCG/ACT inhaler Inhale 2 puffs into the lungs every 6 (six) hours as needed. For shortness of breath    [provider]  etonogestrel-ethinyl estradiol (NUVARING) 0.12-0.015 MG/24HR vaginal ring Place 1 each vaginally every 28 (twenty-eight) days. Insert vaginally and leave in place for 3 consecutive weeks, then remove for 1 week. - Vaginal 04/26/19   Huel Cote, NP  IRON PO Take 1 tablet by mouth daily.    [provider]  ondansetron (ZOFRAN ODT) 4 MG disintegrating tablet Take 1 tablet (4 mg total) by mouth every 8 (eight) hours as needed for up to 10 doses for nausea or vomiting. 02/14/20   Breck Coons, MD  pantoprazole (PROTONIX) 20 MG tablet Take 1 tablet (20 mg total) by mouth daily. 02/14/20   Breck Coons, MD    Allergies    Patient has no known allergies.  Review of Systems   Review of Systems  Constitutional: Positive for appetite change and unexpected weight change. Negative for chills and fever.  HENT: Negative for congestion and rhinorrhea.   Respiratory: Negative for cough and shortness of breath.   Cardiovascular: Negative for  chest pain and palpitations.  Gastrointestinal: Positive for nausea. Negative for diarrhea and vomiting.  Genitourinary: Negative for difficulty urinating and dysuria.  Musculoskeletal: Negative for arthralgias and back pain.  Skin: Negative for rash and wound.  Neurological: Negative for light-headedness and headaches.    Physical Exam Updated Vital Signs BP (!) 139/94 (BP Location: Right Arm)   Pulse (!) 105   Temp 98.5 F (36.9 C) (Oral)   Resp 16   Ht 5\' 7"  (1.702 m)   Wt 54.9 kg   LMP 02/06/2020   SpO2 100%   BMI 18.95 kg/m   Physical Exam Vitals and nursing note reviewed. Exam conducted with a chaperone present.  Constitutional:      General: She is not in acute distress.    Appearance: Normal appearance.  HENT:     Head: Normocephalic and atraumatic.     Nose: No rhinorrhea.       Mouth/Throat:     Mouth: Mucous membranes are moist.     Pharynx: No oropharyngeal exudate or posterior oropharyngeal erythema.  Eyes:     General:        Right eye: No discharge.        Left eye: No discharge.     Conjunctiva/sclera: Conjunctivae normal.  Cardiovascular:     Rate and Rhythm: Normal rate and regular rhythm.  Pulmonary:     Effort: Pulmonary effort is normal. No respiratory distress.     Breath sounds: No stridor.  Abdominal:     General: Abdomen is flat. There is no distension.     Palpations: Abdomen is soft.     Tenderness: There is no abdominal tenderness. There is no right CVA tenderness, left CVA tenderness or guarding.     Comments: No organomegaly  Musculoskeletal:        General: No tenderness or signs of injury.  Skin:    General: Skin is warm and dry.     Capillary Refill: Capillary refill takes less than 2 seconds.  Neurological:     General: No focal deficit present.     Mental Status: She is alert. Mental status is at baseline.     Motor: No weakness.  Psychiatric:        Mood and Affect: Mood normal.        Behavior: Behavior normal.     ED Results / Procedures / Treatments   Labs (all labs ordered are listed, but only abnormal results are displayed) Labs Reviewed  COMPREHENSIVE METABOLIC PANEL - Abnormal; Notable for the following components:      Result Value   CO2 21 (*)    BUN 5 (*)    All other components within normal limits  CBC - Abnormal; Notable for the following components:   RBC 5.37 (*)    MCV 79.5 (*)    MCH 25.9 (*)    All other components within normal limits  URINALYSIS, ROUTINE W REFLEX MICROSCOPIC - Abnormal; Notable for the following components:   APPearance HAZY (*)    Leukocytes,Ua TRACE (*)    Bacteria, UA RARE (*)    All other components within normal limits  LIPASE, BLOOD  TSH  MONONUCLEOSIS SCREEN  T4, FREE  I-STAT BETA HCG BLOOD, ED ([facility], [facility], [facility] ONLY)    EKG None  Radiology No results  found.  Procedures Procedures (including critical care time)  Medications Ordered in ED Medications - No data to display  ED Course  I have reviewed the triage vital signs and  the nursing notes.  Pertinent labs & imaging results that were available during my care of the patient were reviewed by me and considered in my medical decision making (see chart for details).    MDM Rules/Calculators/A&P                          Patient is complained of sore throat and nausea for weeks.  No inciting event.  Weight loss.  Her abdominal exam is benign.  Her vital signs show mild tachycardia but she is otherwise stable.  Afebrile.  No signs of peritonitis on exam.  Appears well-hydrated.  Laboratory studies were reviewed by myself shows no endorgan dysfunction or significant electrolyte derangements.  No significant blood light abnormalities.  Normal urine studies as well as negative pregnancy test.  Patient is concerned for thyroid disease, and told she needs endoscopy.  She is requesting endoscopy tonight.  I informed her that we would not be performing emergent endoscopy on her today.  Thyroid studies will be sent.  With the sore throat and abdominal discomfort/nausea she will get a Monospot as well.  She has had negative strep testing negative viral testing.  Additional labs ordered are within normal limits after my review.  The patient remained stable.  Resting comfortably.  Well-hydrated.  Protonix is given as well as Zofran.  Ambulatory referral to gastroenterology and the phone number have been provided.  Strict return precautions are provided  Final Clinical Impression(s) / ED Diagnoses Final diagnoses:  Nausea  Weight loss    Rx / DC Orders ED Discharge Orders         Ordered    Ambulatory referral to Gastroenterology        02/14/20 0057    pantoprazole (PROTONIX) 20 MG tablet  Daily        02/14/20 0227    ondansetron (ZOFRAN ODT) 4 MG disintegrating tablet  Every 8 hours PRN         02/14/20 0227           Breck Coons, MD 02/14/20 [phone]

## 2020-03-01 ENCOUNTER — Telehealth: Payer: Self-pay | Admitting: *Deleted

## 2020-03-01 MED ORDER — ETONOGESTREL-ETHINYL ESTRADIOL 0.12-0.015 MG/24HR VA RING: VAGINAL_RING | VAGINAL | 0 refills | Status: DC

## 2020-03-01 NOTE — Telephone Encounter (Signed)
Patient called asking if 3 month supply of generic nuvaring can be sent to mail [facility]. Rx sent, annual exam scheduled on 04/10/20.

## 2020-03-02 ENCOUNTER — Ambulatory Visit: Payer: 59 | Admitting: Gastroenterology

## 2020-03-23 ENCOUNTER — Ambulatory Visit: Payer: 59 | Admitting: Gastroenterology

## 2020-04-10 ENCOUNTER — Other Ambulatory Visit: Payer: Self-pay

## 2020-04-10 ENCOUNTER — Encounter: Payer: Self-pay | Admitting: Nurse Practitioner

## 2020-04-10 ENCOUNTER — Ambulatory Visit (INDEPENDENT_AMBULATORY_CARE_PROVIDER_SITE_OTHER): Payer: 59 | Admitting: Nurse Practitioner

## 2020-04-10 VITALS — BP 128/80 | Ht 67.0 in | Wt 128.0 lb

## 2020-04-10 DIAGNOSIS — Z113 Encounter for screening for infections with a predominantly sexual mode of transmission: Secondary | ICD-10-CM

## 2020-04-10 DIAGNOSIS — Z3044 Encounter for surveillance of vaginal ring hormonal contraceptive device: Secondary | ICD-10-CM

## 2020-04-10 DIAGNOSIS — Z01419 Encounter for gynecological examination (general) (routine) without abnormal findings: Secondary | ICD-10-CM | POA: Diagnosis not present

## 2020-04-10 MED ORDER — ETONOGESTREL-ETHINYL ESTRADIOL 0.12-0.015 MG/24HR VA RING: VAGINAL_RING | VAGINAL | 3 refills | Status: DC

## 2020-04-10 NOTE — Progress Notes (Signed)
   Amanda Ingram Nov 14, 2000 [id number]   History:  19 y.o. G0 presents for annual exam without GYN complaints. Monthly cycle on Nuvaring. Gardasil series completed. Not currently sexually active. Would like STD testing today.  Family history of thyroid disease.  Was recently tested for this and was normal.  Gynecologic History Patient's last menstrual period was 04/03/2020. Period Cycle (Days): 28 Period Duration (Days): 5 Period Pattern: Regular Menstrual Flow: Moderate Menstrual Control: Tampon Dysmenorrhea: (!) Mild Dysmenorrhea Symptoms: Cramping Contraception: NuvaRing vaginal inserts Last Pap: Never   Past medical history, past surgical history, family history and social history were all reviewed and documented in the EPIC chart.  [employer] graduate with bio degree.   ROS:  A ROS was performed and pertinent positives and negatives are included.  Exam:  Vitals:   04/10/20 0842  BP: 128/80  Weight: 128 lb (58.1 kg)  Height: 5\' 7"  (1.702 m)   Body mass index is 20.05 kg/m.  General appearance:  Normal Thyroid:  Symmetrical, normal in size, without palpable masses or nodularity. Respiratory  Auscultation:  Clear without wheezing or rhonchi Cardiovascular  Auscultation:  Regular rate, without rubs, murmurs or gallops  Edema/varicosities:  Not grossly evident Abdominal  Soft,nontender, without masses, guarding or rebound.  Liver/spleen:  No organomegaly noted  Hernia:  None appreciated  Skin  Inspection:  Grossly normal   Breasts: Examined lying and sitting.   Right: Without masses, retractions, discharge or axillary adenopathy.   Left: Without masses, retractions, discharge or axillary adenopathy. Gentitourinary   Inguinal/mons:  Normal without inguinal adenopathy  External genitalia:  Normal  BUS/Urethra/Skene's glands:  Normal  Vagina:  Normal, Nuvaring in place  Cervix:  Normal  Uterus:  Normal in size, shape and contour.  Midline and  mobile  Adnexa/parametria:     Rt: Without masses or tenderness.   Lt: Without masses or tenderness.  Anus and perineum: Normal  Assessment/Plan:  19 y.o. G0 for annual exam.   Well female exam with routine gynecological exam - Plan: CBC with Differential/Platelet, Comprehensive metabolic panel. Education provided on SBEs, importance of preventative screenings, current guidelines, high calcium diet, regular exercise, and multivitamin daily.   Encounter for surveillance of vaginal ring hormonal contraceptive device - Plan: etonogestrel-ethinyl estradiol (NUVARING) 0.12-0.015 MG/24HR vaginal ring.  Using as prescribed.  Refill x1 year provided.  Screen for STD (sexually transmitted disease) - Plan: C. trachomatis/N. gonorrhoeae RNA, HIV Antibody (routine testing w rflx), RPR.  Discussed safe sex practices and condom use until permanent partner.  Screening for cervical cancer -Pap with reflex today.  Follow-up in 1 year for annual.      05-11-1973 [facility], 8:57 AM 04/10/2020

## 2020-04-10 NOTE — Patient Instructions (Signed)
Health Maintenance, Female Adopting a healthy lifestyle and getting preventive care are important in promoting health and wellness. Ask your health care provider about:  The right schedule for you to have regular tests and exams.  Things you can do on your own to prevent diseases and keep yourself healthy. What should I know about diet, weight, and exercise? Eat a healthy diet   Eat a diet that includes plenty of vegetables, fruits, low-fat dairy products, and lean protein.  Do not eat a lot of foods that are high in solid fats, added sugars, or sodium. Maintain a healthy weight Body mass index (BMI) is used to identify weight problems. It estimates body fat based on height and weight. Your health care provider can help determine your BMI and help you achieve or maintain a healthy weight. Get regular exercise Get regular exercise. This is one of the most important things you can do for your health. Most adults should:  Exercise for at least 150 minutes each week. The exercise should increase your heart rate and make you sweat (moderate-intensity exercise).  Do strengthening exercises at least twice a week. This is in addition to the moderate-intensity exercise.  Spend less time sitting. Even light physical activity can be beneficial. Watch cholesterol and blood lipids Have your blood tested for lipids and cholesterol at 20 years of age, then have this test every 5 years. Have your cholesterol levels checked more often if:  Your lipid or cholesterol levels are high.  You are older than 21 years of age.  You are at high risk for heart disease. What should I know about cancer screening? Depending on your health history and family history, you may need to have cancer screening at various ages. This may include screening for:  Breast cancer.  Cervical cancer.  Colorectal cancer.  Skin cancer.  Lung cancer. What should I know about heart disease, diabetes, and high blood  pressure? Blood pressure and heart disease  High blood pressure causes heart disease and increases the risk of stroke. This is more likely to develop in people who have high blood pressure readings, are of African descent, or are overweight.  Have your blood pressure checked: ? Every 3-5 years if you are 18-39 years of age. ? Every year if you are 40 years old or older. Diabetes Have regular diabetes screenings. This checks your fasting blood sugar level. Have the screening done:  Once every three years after age 40 if you are at a normal weight and have a low risk for diabetes.  More often and at a younger age if you are overweight or have a high risk for diabetes. What should I know about preventing infection? Hepatitis B If you have a higher risk for hepatitis B, you should be screened for this virus. Talk with your health care provider to find out if you are at risk for hepatitis B infection. Hepatitis C Testing is recommended for:  Everyone born from 1945 through 1965.  Anyone with known risk factors for hepatitis C. Sexually transmitted infections (STIs)  Get screened for STIs, including gonorrhea and chlamydia, if: ? You are sexually active and are younger than 21 years of age. ? You are older than 21 years of age and your health care provider tells you that you are at risk for this type of infection. ? Your sexual activity has changed since you were last screened, and you are at increased risk for chlamydia or gonorrhea. Ask your health care provider if   you are at risk.  Ask your health care provider about whether you are at high risk for HIV. Your health care provider may recommend a prescription medicine to help prevent HIV infection. If you choose to take medicine to prevent HIV, you should first get tested for HIV. You should then be tested every 3 months for as long as you are taking the medicine. Pregnancy  If you are about to stop having your period (premenopausal) and  you may become pregnant, seek counseling before you get pregnant.  Take 400 to 800 micrograms (mcg) of folic acid every day if you become pregnant.  Ask for birth control (contraception) if you want to prevent pregnancy. Osteoporosis and menopause Osteoporosis is a disease in which the bones lose minerals and strength with aging. This can result in bone fractures. If you are 65 years old or older, or if you are at risk for osteoporosis and fractures, ask your health care provider if you should:  Be screened for bone loss.  Take a calcium or vitamin D supplement to lower your risk of fractures.  Be given hormone replacement therapy (HRT) to treat symptoms of menopause. Follow these instructions at home: Lifestyle  Do not use any products that contain nicotine or tobacco, such as cigarettes, e-cigarettes, and chewing tobacco. If you need help quitting, ask your health care provider.  Do not use street drugs.  Do not share needles.  Ask your health care provider for help if you need support or information about quitting drugs. Alcohol use  Do not drink alcohol if: ? Your health care provider tells you not to drink. ? You are pregnant, may be pregnant, or are planning to become pregnant.  If you drink alcohol: ? Limit how much you use to 0-1 drink a day. ? Limit intake if you are breastfeeding.  Be aware of how much alcohol is in your drink. In the [place] one drink equals one 12 oz bottle of beer (355 mL), one 5 oz glass of wine (148 mL), or one 1 oz glass of hard liquor (44 mL). General instructions  Schedule regular health, dental, and eye exams.  Stay current with your vaccines.  Tell your health care provider if: ? You often feel depressed. ? You have ever been abused or do not feel safe at home. Summary  Adopting a healthy lifestyle and getting preventive care are important in promoting health and wellness.  Follow your health care provider's instructions about healthy  diet, exercising, and getting tested or screened for diseases.  Follow your health care provider's instructions on monitoring your cholesterol and blood pressure. This information is not intended to replace advice given to you by your health care provider. Make sure you discuss any questions you have with your health care provider. Document Revised: 03/25/2018 Document Reviewed: 03/25/2018 Elsevier Patient Education  2020 Elsevier Inc.  

## 2020-04-10 NOTE — Addendum Note (Signed)
Addended by: Tito Dine on: 04/10/2020 09:21 AM   Modules accepted: Orders

## 2020-04-11 LAB — COMPREHENSIVE METABOLIC PANEL
AG Ratio: 1.5 (calc) (ref 1.0–2.5)
ALT: 11 U/L (ref 6–29)
AST: 12 U/L (ref 10–30)
Albumin: 3.8 g/dL (ref 3.6–5.1)
Alkaline phosphatase (APISO): 44 U/L (ref 31–125)
BUN: 11 mg/dL (ref 7–25)
CO2: 23 mmol/L (ref 20–32)
Calcium: 8.7 mg/dL (ref 8.6–10.2)
Chloride: 106 mmol/L (ref 98–110)
Creat: 0.77 mg/dL (ref 0.50–1.10)
Globulin: 2.5 g/dL (calc) (ref 1.9–3.7)
Glucose, Bld: 77 mg/dL (ref 65–99)
Potassium: 3.6 mmol/L (ref 3.5–5.3)
Sodium: 138 mmol/L (ref 135–146)
Total Bilirubin: 0.5 mg/dL (ref 0.2–1.2)
Total Protein: 6.3 g/dL (ref 6.1–8.1)

## 2020-04-11 LAB — CBC WITH DIFFERENTIAL/PLATELET
Absolute Monocytes: 353 cells/uL (ref 200–950)
Basophils Absolute: 50 cells/uL (ref 0–200)
Basophils Relative: 0.8 %
Eosinophils Absolute: 252 cells/uL (ref 15–500)
Eosinophils Relative: 4 %
HCT: 40.2 % (ref 35.0–45.0)
Hemoglobin: 13.1 g/dL (ref 11.7–15.5)
Lymphs Abs: 2186 cells/uL (ref 850–3900)
MCH: 25.4 pg — ABNORMAL LOW (ref 27.0–33.0)
MCHC: 32.6 g/dL (ref 32.0–36.0)
MCV: 77.9 fL — ABNORMAL LOW (ref 80.0–100.0)
MPV: 10.4 fL (ref 7.5–12.5)
Monocytes Relative: 5.6 %
Neutro Abs: 3459 cells/uL (ref 1500–7800)
Neutrophils Relative %: 54.9 %
Platelets: 233 10*3/uL (ref 140–400)
RBC: 5.16 10*6/uL — ABNORMAL HIGH (ref 3.80–5.10)
RDW: 12.5 % (ref 11.0–15.0)
Total Lymphocyte: 34.7 %
WBC: 6.3 10*3/uL (ref 3.8–10.8)

## 2020-04-11 LAB — C. TRACHOMATIS/N. GONORRHOEAE RNA
C. trachomatis RNA, TMA: NOT DETECTED
N. gonorrhoeae RNA, TMA: NOT DETECTED

## 2020-04-11 LAB — PAP IG W/ RFLX HPV ASCU

## 2020-04-11 LAB — RPR: RPR Ser Ql: NONREACTIVE

## 2020-04-11 LAB — HIV ANTIBODY (ROUTINE TESTING W REFLEX): HIV 1&2 Ab, 4th Generation: NONREACTIVE

## 2020-06-21 ENCOUNTER — Other Ambulatory Visit: Payer: Self-pay | Admitting: Family Medicine

## 2020-06-21 DIAGNOSIS — M7989 Other specified soft tissue disorders: Secondary | ICD-10-CM

## 2020-07-18 ENCOUNTER — Ambulatory Visit
Admission: RE | Admit: 2020-07-18 | Discharge: 2020-07-18 | Disposition: A | Discharge status: Home / Self Care | Payer: 59 | Source: Ambulatory Visit | Attending: Family Medicine | Admitting: Family Medicine

## 2020-07-18 ENCOUNTER — Other Ambulatory Visit: Payer: Self-pay

## 2020-07-18 DIAGNOSIS — M7989 Other specified soft tissue disorders: Secondary | ICD-10-CM

## 2020-09-10 ENCOUNTER — Encounter: Payer: Self-pay | Admitting: Nurse Practitioner

## 2021-01-23 ENCOUNTER — Encounter: Payer: Self-pay | Admitting: Nurse Practitioner

## 2021-04-04 ENCOUNTER — Other Ambulatory Visit: Payer: Self-pay | Admitting: Surgery

## 2021-04-10 NOTE — Progress Notes (Signed)
Amanda Ingram 05-Oct-2000 [id number]   History:  20 y.o. G0 presents for annual exam. Monthly cycle with Nuvaring. Wants to discuss contraception. She has had a change from brand to generic 2-3 months ago and experiences constant cramping and feels her bleeding time is longer. She also reports frequent yeast infections. Uses OTC medication with good management. Gardasil series completed. Having right axillary lipoma removed next month.   Gynecologic History Patient's last menstrual period was 03/23/2021 (exact date). Period Cycle (Days): 28 Period Duration (Days): 7 Period Pattern: Regular Menstrual Flow: Heavy Menstrual Control: Tampon Dysmenorrhea: (!) Severe (varies) Dysmenorrhea Symptoms: Cramping, Headache, Nausea Contraception/Family planning: NuvaRing vaginal inserts Sexually active: Yes  Health Maintenance Last Pap: 04/10/2020. Results were: Normal Last mammogram: Not indicated Last colonoscopy: Not indicated Last Dexa: Not indicated   Past medical history, past surgical history, family history and social history were all reviewed and documented in the EPIC chart. [employer] graduate with bio degree. Working as Human resources officer.   ROS:  A ROS was performed and pertinent positives and negatives are included.  Exam:  Vitals:   04/11/21 0856  BP: 126/88  Pulse: 98  SpO2: 99%  Weight: 130 lb (59 kg)  Height: 5' 6.5" (1.689 m)    Body mass index is 20.67 kg/m.  General appearance:  Normal Thyroid:  Symmetrical, normal in size, without palpable masses or nodularity. Respiratory  Auscultation:  Clear without wheezing or rhonchi Cardiovascular  Auscultation:  Regular rate, without rubs, murmurs or gallops  Edema/varicosities:  Not grossly evident Abdominal  Soft,nontender, without masses, guarding or rebound.  Liver/spleen:  No organomegaly noted  Hernia:  None appreciated  Skin  Inspection:  Grossly normal Breasts: Examined lying and sitting.   Right: Without masses,  retractions, nipple discharge or axillary adenopathy. Has small lipoma in lower axillary area.   Left: Without masses, retractions, nipple discharge or axillary adenopathy. Genitourinary   Inguinal/mons:  Normal without inguinal adenopathy  External genitalia:  Normal appearing vulva with no masses, tenderness, or lesions  BUS/Urethra/Skene's glands:  Normal  Vagina:  Normal appearing with normal color and discharge, no lesions  Cervix:  Normal appearing without discharge or lesions  Uterus:  Normal in size, shape and contour.  Midline and mobile, nontender  Adnexa/parametria:     Rt: Normal in size, without masses or tenderness.   Lt: Normal in size, without masses or tenderness.  Anus and perineum: Normal  Wet prep negative  Patient informed chaperone available to be present for breast and pelvic exam. Patient has requested no chaperone to be present. Patient has been advised what will be completed during breast and pelvic exam.   Assessment/Plan:  20 y.o. G0 for annual exam.   Well female exam with routine gynecological exam - Education provided on SBEs, importance of preventative screenings, current guidelines, high calcium diet, regular exercise, and multivitamin daily.   Encounter for surveillance of vaginal ring hormonal contraceptive device - Plan: etonogestrel-ethinyl estradiol (NUVARING) 0.12-0.015 MG/24HR vaginal ring. Was switched from brand to generic 2-3 months ago and has cramping most days. She also feels bleeding time is longer. Pharmacies no longer have brand available.  Encounter for other general counseling and advice on contraception - We discussed option to continue for another 3 months to see if cramping improves. We discussed other options to include Nexplanon, Depo Provera, and IUD. She tried multiple OCPs in the past but did not tolerate due to mood changes/depression. She wants to continue for a few months and if no improvement  she will consider other options.    Vaginal itching - Plan: WET PREP FOR [facility], YEAST, CLUE. Wet prep negative. Recommend returning when symptoms are present to verify yeast infection. Also recommend keeping up with timing and circumstances surrounding symptoms to help identify possible causes.   Screening examination for STD (sexually transmitted disease) - Plan: C. trachomatis/N. gonorrhoeae RNA  Screening for cervical cancer - Normal pap history. Will repeat at 3-year interval per guidelines.   Follow-up in 1 year for annual.      Tamela Gammon [facility], 9:21 AM 04/11/2021

## 2021-04-11 ENCOUNTER — Ambulatory Visit (INDEPENDENT_AMBULATORY_CARE_PROVIDER_SITE_OTHER): Payer: 59 | Admitting: Nurse Practitioner

## 2021-04-11 ENCOUNTER — Encounter: Payer: Self-pay | Admitting: Nurse Practitioner

## 2021-04-11 ENCOUNTER — Other Ambulatory Visit: Payer: Self-pay

## 2021-04-11 VITALS — BP 126/88 | HR 98 | Ht 66.5 in | Wt 130.0 lb

## 2021-04-11 DIAGNOSIS — Z01419 Encounter for gynecological examination (general) (routine) without abnormal findings: Secondary | ICD-10-CM | POA: Diagnosis not present

## 2021-04-11 DIAGNOSIS — Z3009 Encounter for other general counseling and advice on contraception: Secondary | ICD-10-CM

## 2021-04-11 DIAGNOSIS — N898 Other specified noninflammatory disorders of vagina: Secondary | ICD-10-CM | POA: Diagnosis not present

## 2021-04-11 DIAGNOSIS — Z3044 Encounter for surveillance of vaginal ring hormonal contraceptive device: Secondary | ICD-10-CM | POA: Diagnosis not present

## 2021-04-11 DIAGNOSIS — Z113 Encounter for screening for infections with a predominantly sexual mode of transmission: Secondary | ICD-10-CM

## 2021-04-11 LAB — WET PREP FOR TRICH, YEAST, CLUE

## 2021-04-11 MED ORDER — ETONOGESTREL-ETHINYL ESTRADIOL 0.12-0.015 MG/24HR VA RING: VAGINAL_RING | VAGINAL | 3 refills | Status: DC

## 2021-04-12 LAB — C. TRACHOMATIS/N. GONORRHOEAE RNA
C. trachomatis RNA, TMA: NOT DETECTED
N. gonorrhoeae RNA, TMA: NOT DETECTED

## 2021-04-20 ENCOUNTER — Other Ambulatory Visit: Payer: Self-pay

## 2021-04-20 ENCOUNTER — Encounter (HOSPITAL_BASED_OUTPATIENT_CLINIC_OR_DEPARTMENT_OTHER): Payer: Self-pay | Admitting: Surgery

## 2021-05-02 NOTE — Progress Notes (Signed)
Ensure presurgery drink given to pt's mom with instruction to complete drink 2 hours prior to arrival time. CHG soap given with written instructions attached. Mom verbalized understanding.       Enhanced Recovery after Surgery for Orthopedics Enhanced Recovery after Surgery is a protocol used to improve the stress on your body and your recovery after surgery.  Patient Instructions  The night before surgery:  No food after midnight. ONLY clear liquids after midnight  The day of surgery (if you do NOT have diabetes):  Drink ONE (1) Pre-Surgery Clear Ensure as directed.   This drink was given to you during your hospital  pre-op appointment visit. The pre-op nurse will instruct you on the time to drink the  Pre-Surgery Ensure depending on your surgery time. Finish the drink at the designated time by the pre-op nurse.  Nothing else to drink after completing the  Pre-Surgery Clear Ensure.  The day of surgery (if you have diabetes): Drink ONE (1) Gatorade 2 (G2) as directed. This drink was given to you during your hospital  pre-op appointment visit.  The pre-op nurse will instruct you on the time to drink the   Gatorade 2 (G2) depending on your surgery time. Color of the Gatorade may vary. Red is not allowed. Nothing else to drink after completing the  Gatorade 2 (G2).         If you have questions, please contact your surgeons office.

## 2021-05-02 NOTE — H&P (Signed)
°  REFERRING PHYSICIAN: Arnetha Courser, MD  PROVIDER: Beverlee Nims, MD  MRN: [id number] DOB: 2001/02/14 DATE OF ENCOUNTER: 04/04/2021 Subjective   Chief Complaint: Right axilla lump   History of Present Illness: Amanda Ingram is a 21 y.o. female who is seen today as an office consultation at the request of Dr. Orland Mustard for evaluation of Right axilla lump .   This is a 21 year old female who presents with right axillary mass. She reports it has been present for several years and is getting larger. It is now causing discomfort. She reports that it hurts daily with moderate pain especially with activities. Previous imaging was nonspecific. There is no family history of breast cancer  Review of Systems: A complete review of systems was obtained from the patient. I have reviewed this information and discussed as appropriate with the patient. See HPI as well for other ROS.  ROS   Medical History: Past Medical History:  Diagnosis Date   Anemia   Asthma, unspecified asthma severity, unspecified whether complicated, unspecified whether persistent   Menorrhagia  NuvaRing in place   There is no problem list on file for this patient.  Past Surgical History:  Procedure Laterality Date   APPENDECTOMY 2018   APPENDECTOMY    No Known Allergies  Current Outpatient Medications on File Prior to Visit  Medication Sig Dispense Refill   NUVARING 0.12-0.015 mg/24 hr vaginal ring PLEASE SEE ATTACHED FOR DETAILED DIRECTIONS   VENTOLIN HFA 90 mcg/actuation inhaler Inhale 90 mcg into the lungs every 6 (six) hours as needed   No current facility-administered medications on file prior to visit.   Family History  Problem Relation Age of Onset   Hyperlipidemia (Elevated cholesterol) Mother    Social History   Tobacco Use  Smoking Status Never  Smokeless Tobacco Never    Social History   Socioeconomic History   Marital status: Single  Tobacco Use   Smoking status: Never    Smokeless tobacco: Never  Substance and Sexual Activity   Alcohol use: Never   Drug use: Never   Objective:   Vitals:  04/04/21 1017  BP: 122/80  Pulse: 80  Temp: 36.9 C (98.4 F)  SpO2: 99%  Weight: 57.2 kg (126 lb 3.2 oz)  Height: 170.2 cm (5\' 7" )   Body mass index is 19.77 kg/m.  Physical Exam   She appears well exam  On examination of the right axilla, there is a firm, slightly mobile 3 to 4 cm mass. There is no deep axillary adenopathy  Her left axilla is normal  There is no cervical adenopathy  Lungs clear CV RRR Abdomen soft, NT  Labs, Imaging and Diagnostic Testing:  Assessment and Plan:   Diagnoses and all orders for this visit:  Axillary mass, right    This is a right axillary mass of unknown etiology. Completely rule out a desmoid tumor or even accessory breast tissue. Because of this increase in size and firmness, surgical excision is strongly recommended for complete histologic evaluation to rule out malignancy. We discussed the surgical procedure in detail. We discussed the risk which includes but is not limited to bleeding, infection, recurrence, seroma formation postoperatively, injury to surrounding structures, etc. We discussed postoperative recovery as well. She understands and wishes to proceed with surgery

## 2021-05-03 ENCOUNTER — Ambulatory Visit (HOSPITAL_BASED_OUTPATIENT_CLINIC_OR_DEPARTMENT_OTHER): Payer: 59 | Admitting: Anesthesiology

## 2021-05-03 ENCOUNTER — Encounter (HOSPITAL_BASED_OUTPATIENT_CLINIC_OR_DEPARTMENT_OTHER)
Admission: RE | Disposition: A | Discharge status: Home / Self Care | Payer: Self-pay | Source: Home / Self Care | Attending: Surgery

## 2021-05-03 ENCOUNTER — Encounter (HOSPITAL_BASED_OUTPATIENT_CLINIC_OR_DEPARTMENT_OTHER): Payer: Self-pay | Admitting: Surgery

## 2021-05-03 ENCOUNTER — Ambulatory Visit (HOSPITAL_BASED_OUTPATIENT_CLINIC_OR_DEPARTMENT_OTHER)
Admission: RE | Admit: 2021-05-03 | Discharge: 2021-05-03 | Disposition: A | Discharge status: Home / Self Care | Payer: 59 | Attending: Surgery | Admitting: Surgery

## 2021-05-03 ENCOUNTER — Other Ambulatory Visit: Payer: Self-pay

## 2021-05-03 DIAGNOSIS — R2231 Localized swelling, mass and lump, right upper limb: Secondary | ICD-10-CM | POA: Diagnosis present

## 2021-05-03 DIAGNOSIS — D759 Disease of blood and blood-forming organs, unspecified: Secondary | ICD-10-CM | POA: Insufficient documentation

## 2021-05-03 DIAGNOSIS — J45909 Unspecified asthma, uncomplicated: Secondary | ICD-10-CM | POA: Diagnosis not present

## 2021-05-03 DIAGNOSIS — D649 Anemia, unspecified: Secondary | ICD-10-CM | POA: Diagnosis not present

## 2021-05-03 HISTORY — PX: EXCISION MASS UPPER EXTREMETIES: SHX6704

## 2021-05-03 LAB — POCT PREGNANCY, URINE: Preg Test, Ur: NEGATIVE

## 2021-05-03 SURGERY — EXCISION MASS UPPER EXTREMITIES
Anesthesia: General | Site: Axilla | Laterality: Right

## 2021-05-03 MED ORDER — FENTANYL CITRATE (PF) 100 MCG/2ML IJ SOLN: 25.0000 ug | INTRAMUSCULAR | Status: DC | PRN

## 2021-05-03 MED ORDER — CHLORHEXIDINE GLUCONATE CLOTH 2 % EX PADS: 6.0000 | MEDICATED_PAD | Freq: Once | CUTANEOUS | Status: DC

## 2021-05-03 MED ORDER — OXYCODONE HCL 5 MG PO TABS: 5.0000 mg | ORAL_TABLET | Freq: Once | ORAL | Status: DC | PRN

## 2021-05-03 MED ORDER — FENTANYL CITRATE (PF) 100 MCG/2ML IJ SOLN
INTRAMUSCULAR | Status: AC
  Filled 2021-05-03: qty 2

## 2021-05-03 MED ORDER — ONDANSETRON HCL 4 MG/2ML IJ SOLN
INTRAMUSCULAR | Status: AC
  Filled 2021-05-03: qty 2

## 2021-05-03 MED ORDER — ACETAMINOPHEN 500 MG PO TABS: 1000.0000 mg | ORAL_TABLET | Freq: Once | ORAL | Status: AC

## 2021-05-03 MED ORDER — ACETAMINOPHEN 500 MG PO TABS
ORAL_TABLET | ORAL | Status: AC
  Filled 2021-05-03: qty 1

## 2021-05-03 MED ORDER — SCOPOLAMINE 1 MG/3DAYS TD PT72
1.0000 | MEDICATED_PATCH | TRANSDERMAL | Status: DC
  Administered 2021-05-03: 1.5 mg via TRANSDERMAL

## 2021-05-03 MED ORDER — LIDOCAINE 2% (20 MG/ML) 5 ML SYRINGE
INTRAMUSCULAR | Status: AC
  Filled 2021-05-03: qty 5

## 2021-05-03 MED ORDER — ACETAMINOPHEN 500 MG PO TABS
1000.0000 mg | ORAL_TABLET | ORAL | Status: AC
  Administered 2021-05-03: 1000 mg via ORAL

## 2021-05-03 MED ORDER — CEFAZOLIN SODIUM-DEXTROSE 2-4 GM/100ML-% IV SOLN
2.0000 g | INTRAVENOUS | Status: AC
  Administered 2021-05-03: 2 g via INTRAVENOUS

## 2021-05-03 MED ORDER — SCOPOLAMINE 1 MG/3DAYS TD PT72
MEDICATED_PATCH | TRANSDERMAL | Status: AC
  Filled 2021-05-03: qty 1

## 2021-05-03 MED ORDER — ENSURE PRE-SURGERY PO LIQD: 296.0000 mL | Freq: Once | ORAL | Status: DC

## 2021-05-03 MED ORDER — MEPERIDINE HCL 25 MG/ML IJ SOLN: 6.2500 mg | INTRAMUSCULAR | Status: DC | PRN

## 2021-05-03 MED ORDER — MIDAZOLAM HCL 5 MG/5ML IJ SOLN
INTRAMUSCULAR | Status: DC | PRN
  Administered 2021-05-03: 40 mg via INTRAVENOUS
  Administered 2021-05-03: 2 mg via INTRAVENOUS

## 2021-05-03 MED ORDER — DEXAMETHASONE SODIUM PHOSPHATE 4 MG/ML IJ SOLN
INTRAMUSCULAR | Status: DC | PRN
  Administered 2021-05-03: 5 mg via INTRAVENOUS

## 2021-05-03 MED ORDER — PROPOFOL 10 MG/ML IV BOLUS
INTRAVENOUS | Status: AC
  Filled 2021-05-03: qty 20

## 2021-05-03 MED ORDER — LACTATED RINGERS IV SOLN: INTRAVENOUS | Status: DC

## 2021-05-03 MED ORDER — FENTANYL CITRATE (PF) 100 MCG/2ML IJ SOLN
INTRAMUSCULAR | Status: DC | PRN
  Administered 2021-05-03 (×2): 50 ug via INTRAVENOUS
  Administered 2021-05-03: 100 ug via INTRAVENOUS

## 2021-05-03 MED ORDER — PROPOFOL 10 MG/ML IV BOLUS
INTRAVENOUS | Status: DC | PRN
  Administered 2021-05-03: 150 mg via INTRAVENOUS

## 2021-05-03 MED ORDER — TRAMADOL HCL 50 MG PO TABS
50.0000 mg | ORAL_TABLET | Freq: Four times a day (QID) | ORAL | 0 refills | Status: DC | PRN
Start: 2021-05-03 — End: 2021-12-18

## 2021-05-03 MED ORDER — OXYCODONE HCL 5 MG/5ML PO SOLN: 5.0000 mg | Freq: Once | ORAL | Status: DC | PRN

## 2021-05-03 MED ORDER — DEXAMETHASONE SODIUM PHOSPHATE 10 MG/ML IJ SOLN
INTRAMUSCULAR | Status: AC
  Filled 2021-05-03: qty 1

## 2021-05-03 MED ORDER — ONDANSETRON HCL 4 MG/2ML IJ SOLN
INTRAMUSCULAR | Status: DC | PRN
Start: 2021-05-03 — End: 2021-05-03
  Administered 2021-05-03: 4 mg via INTRAVENOUS

## 2021-05-03 MED ORDER — BUPIVACAINE-EPINEPHRINE 0.5% -1:200000 IJ SOLN
INTRAMUSCULAR | Status: DC | PRN
  Administered 2021-05-03: 20 mL

## 2021-05-03 MED ORDER — MIDAZOLAM HCL 2 MG/2ML IJ SOLN: 0.5000 mg | Freq: Once | INTRAMUSCULAR | Status: DC | PRN

## 2021-05-03 MED ORDER — PROMETHAZINE HCL 25 MG/ML IJ SOLN: 6.2500 mg | INTRAMUSCULAR | Status: DC | PRN

## 2021-05-03 MED ORDER — CEFAZOLIN SODIUM-DEXTROSE 2-4 GM/100ML-% IV SOLN
INTRAVENOUS | Status: AC
  Filled 2021-05-03: qty 100

## 2021-05-03 MED ORDER — LIDOCAINE 2% (20 MG/ML) 5 ML SYRINGE
INTRAMUSCULAR | Status: DC | PRN
  Administered 2021-05-03: 60 mg via INTRAVENOUS

## 2021-05-03 MED ORDER — ACETAMINOPHEN 500 MG PO TABS
ORAL_TABLET | ORAL | Status: AC
  Filled 2021-05-03: qty 2

## 2021-05-03 MED ORDER — MIDAZOLAM HCL 2 MG/2ML IJ SOLN
INTRAMUSCULAR | Status: AC
  Filled 2021-05-03: qty 2

## 2021-05-03 SURGICAL SUPPLY — 36 items
ADH SKN CLS APL DERMABOND .7 (GAUZE/BANDAGES/DRESSINGS) ×1
APL PRP STRL LF DISP 70% ISPRP (MISCELLANEOUS) ×1
BLADE SURG 15 STRL LF DISP TIS (BLADE) ×1 IMPLANT
BLADE SURG 15 STRL SS (BLADE) ×2
CHLORAPREP W/TINT 26 (MISCELLANEOUS) ×2 IMPLANT
COVER BACK TABLE 60X90IN (DRAPES) ×2 IMPLANT
COVER MAYO STAND STRL (DRAPES) ×2 IMPLANT
DERMABOND ADVANCED (GAUZE/BANDAGES/DRESSINGS) ×1
DERMABOND ADVANCED .7 DNX12 (GAUZE/BANDAGES/DRESSINGS) ×1 IMPLANT
DRAPE LAPAROTOMY 100X72 PEDS (DRAPES) ×2 IMPLANT
DRAPE UTILITY XL STRL (DRAPES) ×2 IMPLANT
ELECT REM PT RETURN 9FT ADLT (ELECTROSURGICAL) ×2
ELECTRODE REM PT RTRN 9FT ADLT (ELECTROSURGICAL) ×1 IMPLANT
GLOVE SURG POLYISO LF SZ7 (GLOVE) ×1 IMPLANT
GLOVE SURG POLYISO LF SZ7.5 (GLOVE) ×1 IMPLANT
GLOVE SURG SIGNA 7.5 PF LTX (GLOVE) ×2 IMPLANT
GLOVE SURG UNDER POLY LF SZ7 (GLOVE) ×1 IMPLANT
GLOVE SURG UNDER POLY LF SZ7.5 (GLOVE) ×1 IMPLANT
GOWN STRL REUS W/ TWL LRG LVL3 (GOWN DISPOSABLE) ×1 IMPLANT
GOWN STRL REUS W/ TWL XL LVL3 (GOWN DISPOSABLE) ×1 IMPLANT
GOWN STRL REUS W/TWL LRG LVL3 (GOWN DISPOSABLE) ×2
GOWN STRL REUS W/TWL XL LVL3 (GOWN DISPOSABLE) ×4
NDL HYPO 25X1 1.5 SAFETY (NEEDLE) ×1 IMPLANT
NEEDLE HYPO 25X1 1.5 SAFETY (NEEDLE) ×2 IMPLANT
NS IRRIG 1000ML POUR BTL (IV SOLUTION) ×2 IMPLANT
PACK BASIN DAY SURGERY FS (CUSTOM PROCEDURE TRAY) ×2 IMPLANT
PENCIL SMOKE EVACUATOR (MISCELLANEOUS) ×2 IMPLANT
SLEEVE SCD COMPRESS KNEE MED (STOCKING) ×2 IMPLANT
SPONGE T-LAP 4X18 ~~LOC~~+RFID (SPONGE) ×2 IMPLANT
SUT MNCRL AB 4-0 PS2 18 (SUTURE) ×2 IMPLANT
SUT VIC AB 3-0 SH 27 (SUTURE) ×2
SUT VIC AB 3-0 SH 27X BRD (SUTURE) IMPLANT
SYR CONTROL 10ML LL (SYRINGE) ×2 IMPLANT
TOWEL GREEN STERILE FF (TOWEL DISPOSABLE) ×2 IMPLANT
TUBE CONNECTING 20X1/4 (TUBING) ×2 IMPLANT
YANKAUER SUCT BULB TIP NO VENT (SUCTIONS) ×2 IMPLANT

## 2021-05-03 NOTE — Interval H&P Note (Signed)
History and Physical Interval Note: no change in H and P  05/03/2021 10:58 AM  Amanda Ingram  has presented today for surgery, with the diagnosis of RIGHT AXILLARY MASS.  The various methods of treatment have been discussed with the patient and family. After consideration of risks, benefits and other options for treatment, the patient has consented to  Procedure(s): EXCISION RIGHT AXILLARY MASS (Right) as a surgical intervention.  The patient's history has been reviewed, patient examined, no change in status, stable for surgery.  I have reviewed the patient's chart and labs.  Questions were answered to the patient's satisfaction.     Coralie Keens

## 2021-05-03 NOTE — Discharge Instructions (Addendum)
[place] Office Phone Number [phone]  BREAST BIOPSY/ PARTIAL MASTECTOMY: POST OP INSTRUCTIONS  Always review your discharge instruction sheet given to you by the facility where your surgery was performed.  IF YOU HAVE DISABILITY OR FAMILY LEAVE FORMS, YOU MUST BRING THEM TO THE OFFICE FOR PROCESSING.  DO NOT GIVE THEM TO YOUR DOCTOR.  A prescription for pain medication may be given to you upon discharge.  Take your pain medication as prescribed, if needed.  If narcotic pain medicine is not needed, then you may take acetaminophen (Tylenol) or ibuprofen (Advil) as needed. Take your usually prescribed medications unless otherwise directed If you need a refill on your pain medication, please contact your pharmacy.  They will contact our office to request authorization.  Prescriptions will not be filled after 5pm or on week-ends. You should eat very light the first 24 hours after surgery, such as soup, crackers, pudding, etc.  Resume your normal diet the day after surgery. Most patients will experience some swelling and bruising in the breast.  Ice packs and a good support bra will help.  Swelling and bruising can take several days to resolve.  It is common to experience some constipation if taking pain medication after surgery.  Increasing fluid intake and taking a stool softener will usually help or prevent this problem from occurring.  A mild laxative (Milk of Magnesia or Miralax) should be taken according to package directions if there are no bowel movements after 48 hours. Unless discharge instructions indicate otherwise, you may remove your bandages 24-48 hours after surgery, and you may shower at that time.  You may have steri-strips (small skin tapes) in place directly over the incision.  These strips should be left on the skin for 7-10 days.  If your surgeon used skin glue on the incision, you may shower in 24 hours.  The glue will flake off over the next 2-3 weeks.  Any  sutures or staples will be removed at the office during your follow-up visit. ACTIVITIES:  You may resume regular daily activities (gradually increasing) beginning the next day.  Wearing a good support bra or sports bra minimizes pain and swelling.  You may have sexual intercourse when it is comfortable. You may drive when you no longer are taking prescription pain medication, you can comfortably wear a seatbelt, and you can safely maneuver your car and apply brakes. RETURN TO WORK:  ______________________________________________________________________________________ Dennis Bast should see your doctor in the office for a follow-up appointment approximately two weeks after your surgery.  Your doctors nurse will typically make your follow-up appointment when she calls you with your pathology report.  Expect your pathology report 2-3 business days after your surgery.  You may call to check if you do not hear from [place] after three days. OTHER INSTRUCTIONS: OK TO SHOWER STARTING TOMORROW ICE PACK, TYLENOL, AND IBUPROFEN ALSO FOR PAIN NO VIGOROUS ACTIVITY FOR ONE WEEK _______________________________________________________________________________________________ _____________________________________________________________________________________________________________________________________ _____________________________________________________________________________________________________________________________________ _____________________________________________________________________________________________________________________________________  WHEN TO CALL YOUR DOCTOR: Fever over 101.0 Nausea and/or vomiting. Extreme swelling or bruising. Continued bleeding from incision. Increased pain, redness, or drainage from the incision.  The clinic staff is available to answer your questions during regular business hours.  Please dont hesitate to call and ask to speak to one of the nurses for clinical  concerns.  If you have a medical emergency, go to the nearest emergency room or call 911.  A surgeon from [facility] is always on call at the hospital.  For further questions, please visit  [email or web address]    May take Tylenol after 4pm, if needed.   Post Anesthesia Home Care Instructions  Activity: Get plenty of rest for the remainder of the day. A responsible individual must stay with you for 24 hours following the procedure.  For the next 24 hours, DO NOT: -Drive a car -Paediatric nurse -Drink alcoholic beverages -Take any medication unless instructed by your physician -Make any legal decisions or sign important papers.  Meals: Start with liquid foods such as gelatin or soup. Progress to regular foods as tolerated. Avoid greasy, spicy, heavy foods. If nausea and/or vomiting occur, drink only clear liquids until the nausea and/or vomiting subsides. Call your physician if vomiting continues.  Special Instructions/Symptoms: Your throat may feel dry or sore from the anesthesia or the breathing tube placed in your throat during surgery. If this causes discomfort, gargle with warm salt water. The discomfort should disappear within 24 hours.  If you had a scopolamine patch placed behind your ear for the management of post- operative nausea and/or vomiting:  1. The medication in the patch is effective for 72 hours, after which it should be removed.  Wrap patch in a tissue and discard in the trash. Wash hands thoroughly with soap and water. 2. You may remove the patch earlier than 72 hours if you experience unpleasant side effects which may include dry mouth, dizziness or visual disturbances. 3. Avoid touching the patch. Wash your hands with soap and water after contact with the patch.

## 2021-05-03 NOTE — Anesthesia Procedure Notes (Signed)
Procedure Name: LMA Insertion Date/Time: 05/03/2021 11:19 AM Performed by: Ezequiel Kayser, CRNA Pre-anesthesia Checklist: Patient identified, Emergency Drugs available, Suction available and Patient being monitored Patient Re-evaluated:Patient Re-evaluated prior to induction Oxygen Delivery Method: Circle System Utilized Preoxygenation: Pre-oxygenation with 100% oxygen Induction Type: IV induction Ventilation: Mask ventilation without difficulty LMA: LMA inserted LMA Size: 4.0 Number of attempts: 1 Airway Equipment and Method: Bite block Placement Confirmation: positive ETCO2 Tube secured with: Tape Dental Injury: Teeth and Oropharynx as per pre-operative assessment

## 2021-05-03 NOTE — Anesthesia Postprocedure Evaluation (Signed)
Anesthesia Post Note  Patient: Amanda Ingram  Procedure(s) Performed: EXCISION RIGHT AXILLARY MASS (Right: Axilla)     Patient location during evaluation: PACU Anesthesia Type: General Level of consciousness: awake and alert Pain management: pain level controlled Vital Signs Assessment: post-procedure vital signs reviewed and stable Respiratory status: spontaneous breathing, nonlabored ventilation and respiratory function stable Cardiovascular status: blood pressure returned to baseline and stable Postop Assessment: no apparent nausea or vomiting Anesthetic complications: no   No notable events documented.  Last Vitals:  Vitals:   05/03/21 1230 05/03/21 1243  BP: 119/76 (!) 138/91  Pulse: (!) 109 (!) 108  Resp: 10 15  Temp:  36.6 C  SpO2: 99% 100%    Last Pain:  Vitals:   05/03/21 1243  TempSrc:   PainSc: 0-No pain                 Elantra Caprara,W. EDMOND

## 2021-05-03 NOTE — Transfer of Care (Signed)
Immediate Anesthesia Transfer of Care Note  Patient: Amanda Ingram  Procedure(s) Performed: EXCISION RIGHT AXILLARY MASS (Right: Axilla)  Patient Location: PACU  Anesthesia Type:General  Level of Consciousness: drowsy  Airway & Oxygen Therapy: Patient Spontanous Breathing and Patient connected to face mask oxygen  Post-op Assessment: Report given to RN and Post -op Vital signs reviewed and stable  Post vital signs: Reviewed and stable  Last Vitals:  Vitals Value Taken Time  BP    Temp    Pulse 85 05/03/21 1203  Resp    SpO2 97 % 05/03/21 1203  Vitals shown include unvalidated device data.  Last Pain:  Vitals:   05/03/21 1003  TempSrc: Oral  PainSc: 4       Patients Stated Pain Goal: 2 (02/09/24 3664)  Complications: No notable events documented.

## 2021-05-03 NOTE — Anesthesia Preprocedure Evaluation (Addendum)
Anesthesia Evaluation  Patient identified by MRN, date of birth, ID band Patient awake    Reviewed: Allergy & Precautions, H&P , NPO status , Patient's Chart, lab work & pertinent test results  Airway Mallampati: II  TM Distance: >3 FB Neck ROM: Full    Dental no notable dental hx. (+) Teeth Intact, Dental Advisory Given   Pulmonary asthma ,    Pulmonary exam normal breath sounds clear to auscultation       Cardiovascular negative cardio ROS   Rhythm:Regular Rate:Normal     Neuro/Psych negative neurological ROS  negative psych ROS   GI/Hepatic negative GI ROS, Neg liver ROS,   Endo/Other  negative endocrine ROS  Renal/GU negative Renal ROS  negative genitourinary   Musculoskeletal   Abdominal   Peds  Hematology  (+) Blood dyscrasia, anemia ,   Anesthesia Other Findings   Reproductive/Obstetrics negative OB ROS                            Anesthesia Physical Anesthesia Plan  ASA: 2  Anesthesia Plan: General   Post-op Pain Management: Tylenol PO (pre-op) and Toradol IV (intra-op)   Induction:   PONV Risk Score and Plan: 4 or greater and Ondansetron, Dexamethasone and Midazolam  Airway Management Planned: LMA  Additional Equipment:   Intra-op Plan:   Post-operative Plan: Extubation in OR  Informed Consent: I have reviewed the patients History and Physical, chart, labs and discussed the procedure including the risks, benefits and alternatives for the proposed anesthesia with the patient or authorized representative who has indicated his/her understanding and acceptance.     Dental advisory given  Plan Discussed with: CRNA  Anesthesia Plan Comments:         Anesthesia Quick Evaluation

## 2021-05-03 NOTE — Op Note (Signed)
EXCISION RIGHT AXILLARY MASS  Procedure Note  Amanda Ingram 05/03/2021   Pre-op Diagnosis: RIGHT AXILLARY MASS     Post-op Diagnosis: same  Procedure(s): EXCISION RIGHT AXILLARY MASS (3.5 x 3.5 cm)  Surgeon(s): Coralie Keens, MD  Assist: Radonna Ricker, MD ([facility] Resident)  Anesthesia: General  Staff:  Circulator: Izora Ribas, RN Scrub Person: Lorenza Burton, CST  Estimated Blood Loss: Minimal               Specimens: sent to path  Findings: The patient a 3.5 x 3.5 cm right axillary mass which appeared consistent with accessory breast tissue.  It was sent to pathology for evaluation.  Procedure: The patient is brought to the operating room identifies correct patient.  She is placed upon the operating table general anesthesia was induced.  Her right axilla and chest were then prepped and draped in usual sterile fashion.  The skin was anesthetized with Marcaine and elliptical incision was then performed over the underlying mass.  The incision was then taken down into the axillary tissue with electrocautery.  The tissue underlying the skin appeared consistent with a sensory breast tissue.  Skin flaps were created slightly medial and laterally and this was then excised in entirety coming up underneath it and removed from the axilla.  The mass measured 3.5 x 3.5 cm.  It was sent with the overlying skin to pathology for evaluation.  The axilla was again palpated no other masses and no adenopathy was identified.  Incision was injected further with Marcaine.  Hemostasis was achieved with cautery.  Subcutaneous tissue was then closed with interrupted 3-0 Vicryl sutures and skin was closed with running 4-0 Monocryl.  Dermabond was then applied.  The patient tolerated the procedure well.  All the counts were correct at the end of the procedure.  The patient was then extubated in the operating room and taken in stable condition to the recovery room.          Coralie Keens   Date:  05/03/2021  Time: 12:00 PM

## 2021-05-04 ENCOUNTER — Encounter (HOSPITAL_BASED_OUTPATIENT_CLINIC_OR_DEPARTMENT_OTHER): Payer: Self-pay | Admitting: Surgery

## 2021-05-07 LAB — SURGICAL PATHOLOGY

## 2021-12-05 ENCOUNTER — Encounter: Payer: Self-pay | Admitting: Nurse Practitioner

## 2021-12-18 ENCOUNTER — Encounter: Payer: Self-pay | Admitting: Nurse Practitioner

## 2021-12-18 ENCOUNTER — Ambulatory Visit: Payer: 59 | Admitting: Nurse Practitioner

## 2021-12-18 VITALS — BP 124/80

## 2021-12-18 DIAGNOSIS — N898 Other specified noninflammatory disorders of vagina: Secondary | ICD-10-CM

## 2021-12-18 DIAGNOSIS — R300 Dysuria: Secondary | ICD-10-CM | POA: Diagnosis not present

## 2021-12-18 DIAGNOSIS — Z3009 Encounter for other general counseling and advice on contraception: Secondary | ICD-10-CM | POA: Diagnosis not present

## 2021-12-18 DIAGNOSIS — B3731 Acute candidiasis of vulva and vagina: Secondary | ICD-10-CM

## 2021-12-18 LAB — WET PREP FOR TRICH, YEAST, CLUE

## 2021-12-18 MED ORDER — FLUCONAZOLE 150 MG PO TABS: 150.0000 mg | ORAL_TABLET | ORAL | 0 refills | Status: DC

## 2021-12-18 NOTE — Progress Notes (Signed)
   Acute Office Visit  Subjective:    Patient ID: Amanda Ingram, female    DOB: Oct 07, 2000, 21 y.o.   MRN: [id number]   HPI 21 y.o. presents today for vulvar irritation and to discuss contraception. Denies itching or odor. Sexually active. Would like STD screening today. Using Nuvaring. Was initially started on birth control for heavy menses. Worked great for years but now she is having longer, heavier menses and experiences symptoms one week before and during menses. She just feels really bad, almost flu-like during those 2 weeks. Cannot find anything she could contribute symptoms to other than her menses. She is interested in stopping birth control and tracking cycles.    Review of Systems  Constitutional: Negative.   Genitourinary:  Positive for dysuria, menstrual problem and vaginal discharge. Negative for difficulty urinating, flank pain, frequency, genital sores, hematuria and urgency.       Objective:    Physical Exam Constitutional:      Appearance: Normal appearance.  Genitourinary:    General: Normal vulva.     Vagina: Vaginal discharge present. No erythema.     Cervix: Normal.     BP 124/80 (BP Location: Right Arm, Patient Position: Sitting, Cuff Size: Normal)   LMP 12/06/2021  Wt Readings from Last 3 Encounters:  05/03/21 130 lb 8.2 oz (59.2 kg)  04/11/21 130 lb (59 kg)  04/10/20 128 lb (58.1 kg)        Patient informed chaperone available to be present for breast and/or pelvic exam. Patient has requested no chaperone to be present. Patient has been advised what will be completed during breast and pelvic exam.   Wet prep + yeast UA 1+ leukocytes, negative nitrites, trace blood, yellow cloudy, negative protein. Microscopic: wbc 10-20, rbc 0-5, many bacteria  Assessment & Plan:   Problem List Items Addressed This Visit   None Visit Diagnoses     Vaginal candidiasis    -  Primary   Relevant Medications   fluconazole (DIFLUCAN) 150 MG tablet   Dysuria        Relevant Orders   Urinalysis,Complete w/RFL Culture (Completed)   Vaginal discharge       Relevant Orders   WET PREP FOR [place], YEAST, CLUE (Completed)   C. trachomatis/N. gonorrhoeae RNA   General counseling and advice on female contraception          Plan: Wet prep positive for yeast - Diflucan 150 mg today and repeat in 3 days for total of 2 doses. UA showed small amount of leukocytes. Will wait on culture since this could be from vaginal infection. GC/chlamydia pending. Contraceptive options were reviewed, including hormonal methods, both combination (pill, patch, vaginal ring) and progesterone-only (pill, Depo Provera and Nexplanon), intrauterine devices (Mirena, [place], [place], and [place]), Phexxi, barrier methods (condoms, diaphragm) and female/female sterilization. The mechanisms, risks, benefits and side effects of all methods were discussed.  She would like to stop birth control to see how she does. We discussed natural cycle tracking, ovulation windows, and chance of heavy menses returning. She verbalized understanding and all questions answered.      Tamela Gammon DNP, 4:00 PM 12/18/2021

## 2021-12-19 LAB — C. TRACHOMATIS/N. GONORRHOEAE RNA
C. trachomatis RNA, TMA: NOT DETECTED
N. gonorrhoeae RNA, TMA: NOT DETECTED

## 2021-12-21 LAB — URINALYSIS, COMPLETE W/RFL CULTURE
Bilirubin Urine: NEGATIVE
Casts: NONE SEEN /LPF
Crystals: NONE SEEN /HPF
Glucose, UA: NEGATIVE
Hyaline Cast: NONE SEEN /LPF
Ketones, ur: NEGATIVE
Nitrites, Initial: NEGATIVE
Protein, ur: NEGATIVE
Specific Gravity, Urine: 1.015 (ref 1.001–1.035)
Yeast: NONE SEEN /HPF
pH: 7.5 (ref 5.0–8.0)

## 2021-12-21 LAB — URINE CULTURE
MICRO NUMBER:: 13872167
SPECIMEN QUALITY:: ADEQUATE

## 2021-12-21 LAB — CULTURE INDICATED

## 2021-12-23 ENCOUNTER — Other Ambulatory Visit: Payer: Self-pay | Admitting: Nurse Practitioner

## 2021-12-23 DIAGNOSIS — N3000 Acute cystitis without hematuria: Secondary | ICD-10-CM

## 2021-12-23 MED ORDER — SULFAMETHOXAZOLE-TRIMETHOPRIM 800-160 MG PO TABS: 1.0000 | ORAL_TABLET | Freq: Two times a day (BID) | ORAL | 0 refills | Status: DC

## 2022-04-16 ENCOUNTER — Encounter: Payer: Self-pay | Admitting: Nurse Practitioner

## 2022-04-18 ENCOUNTER — Ambulatory Visit: Payer: 59 | Admitting: Nurse Practitioner

## 2022-04-30 ENCOUNTER — Ambulatory Visit: Payer: 59 | Admitting: Nurse Practitioner

## 2022-04-30 ENCOUNTER — Encounter: Payer: Self-pay | Admitting: Nurse Practitioner

## 2022-04-30 ENCOUNTER — Ambulatory Visit (INDEPENDENT_AMBULATORY_CARE_PROVIDER_SITE_OTHER): Payer: No Typology Code available for payment source | Admitting: Nurse Practitioner

## 2022-04-30 VITALS — BP 102/78 | HR 82 | Ht 65.75 in | Wt 134.0 lb

## 2022-04-30 DIAGNOSIS — Z113 Encounter for screening for infections with a predominantly sexual mode of transmission: Secondary | ICD-10-CM

## 2022-04-30 DIAGNOSIS — Z01419 Encounter for gynecological examination (general) (routine) without abnormal findings: Secondary | ICD-10-CM

## 2022-04-30 NOTE — Progress Notes (Signed)
   Avika L Ferrentino 18-Apr-2000 [id number]   History:  22 y.o. G0 presents for annual exam. Monthly cycles. Stopped Nuvaring. Not interested in contraception at this time. Gardasil series completed. H/O right axillary lipoma removal 04/2021.   Gynecologic History Patient's last menstrual period was 04/30/2022 (exact date). Period Cycle (Days): 28 Period Duration (Days): 2-3 Menstrual Flow: Moderate Menstrual Control: Tampon Dysmenorrhea: (!) Moderate Dysmenorrhea Symptoms: Cramping Contraception/Family planning: None Sexually active: Yes  Health Maintenance Last Pap: 04/10/2020. Results were: Normal Last mammogram: Not indicated Last colonoscopy: Not indicated Last Dexa: Not indicated   Past medical history, past surgical history, family history and social history were all reviewed and documented in the EPIC chart. [employer] graduate with bio degree. Working as Programme researcher, broadcasting/film/video.   ROS:  A ROS was performed and pertinent positives and negatives are included.  Exam:  Vitals:   04/30/22 1413  BP: 102/78  Pulse: 82  SpO2: 99%  Weight: 134 lb (60.8 kg)  Height: 5' 5.75" (1.67 m)     Body mass index is 21.79 kg/m.  General appearance:  Normal Thyroid:  Symmetrical, normal in size, without palpable masses or nodularity. Respiratory  Auscultation:  Clear without wheezing or rhonchi Cardiovascular  Auscultation:  Regular rate, without rubs, murmurs or gallops  Edema/varicosities:  Not grossly evident Abdominal  Soft,nontender, without masses, guarding or rebound.  Liver/spleen:  No organomegaly noted  Hernia:  None appreciated  Skin  Inspection:  Grossly normal Breasts: Not indicated per guidelines Genitourinary   Inguinal/mons:  Normal without inguinal adenopathy  External genitalia:  Normal appearing vulva with no masses, tenderness, or lesions  BUS/Urethra/Skene's glands:  Normal  Vagina:  Normal appearing with normal color and discharge, no lesions  Cervix:  Normal appearing  without discharge or lesions  Uterus:  Normal in size, shape and contour.  Midline and mobile, nontender  Adnexa/parametria:     Rt: Normal in size, without masses or tenderness.   Lt: Normal in size, without masses or tenderness.  Anus and perineum: Normal  Patient informed chaperone available to be present for breast and pelvic exam. Patient has requested no chaperone to be present. Patient has been advised what will be completed during breast and pelvic exam.   Assessment/Plan:  22 y.o. G0 for annual exam.   Well female exam with routine gynecological exam - Education provided on SBEs, importance of preventative screenings, current guidelines, high calcium diet, regular exercise, and multivitamin daily.   Screening examination for STD (sexually transmitted disease) - Plan: SURESWAB CT/NG/T. vaginalis, RPR, HIV Antibody (routine testing w rflx).  Screening for cervical cancer - Normal pap history. Will repeat at 3-year interval per guidelines.   Follow-up in 1 year for annual.      Tamela Gammon [facility], 2:36 PM 04/30/2022

## 2022-05-01 LAB — RPR: RPR Ser Ql: NONREACTIVE

## 2022-05-01 LAB — SURESWAB CT/NG/T. VAGINALIS
C. trachomatis RNA, TMA: NOT DETECTED
N. gonorrhoeae RNA, TMA: NOT DETECTED
Trichomonas vaginalis RNA: NOT DETECTED

## 2022-05-01 LAB — HIV ANTIBODY (ROUTINE TESTING W REFLEX): HIV 1&2 Ab, 4th Generation: NONREACTIVE
